# Patient Record
Sex: Male | Born: 1955 | Race: Black or African American | Hispanic: No | Marital: Single | Smoking: Current every day smoker
Health system: Southern US, Community
[De-identification: ages and names within clinical notes are randomized; demographics above are authoritative.]

## PROBLEM LIST (undated history)

## (undated) DIAGNOSIS — C801 Malignant (primary) neoplasm, unspecified: Secondary | ICD-10-CM

---

## 2000-04-29 ENCOUNTER — Emergency Department (HOSPITAL_COMMUNITY): Admission: EM | Admit: 2000-04-29 | Discharge: 2000-04-29 | Payer: Self-pay | Admitting: Emergency Medicine

## 2001-10-17 ENCOUNTER — Emergency Department (HOSPITAL_COMMUNITY): Admission: EM | Admit: 2001-10-17 | Discharge: 2001-10-18 | Payer: Self-pay | Admitting: *Deleted

## 2002-07-14 ENCOUNTER — Encounter: Payer: Self-pay | Admitting: Emergency Medicine

## 2002-07-14 ENCOUNTER — Emergency Department (HOSPITAL_COMMUNITY): Admission: EM | Admit: 2002-07-14 | Discharge: 2002-07-14 | Payer: Self-pay | Admitting: Emergency Medicine

## 2003-04-07 ENCOUNTER — Encounter: Payer: Self-pay | Admitting: Emergency Medicine

## 2003-04-07 ENCOUNTER — Emergency Department (HOSPITAL_COMMUNITY): Admission: EM | Admit: 2003-04-07 | Discharge: 2003-04-07 | Payer: Self-pay | Admitting: Emergency Medicine

## 2004-10-30 ENCOUNTER — Ambulatory Visit (HOSPITAL_COMMUNITY): Admission: RE | Admit: 2004-10-30 | Discharge: 2004-10-30 | Payer: Self-pay | Admitting: Emergency Medicine

## 2004-11-04 ENCOUNTER — Ambulatory Visit (HOSPITAL_COMMUNITY): Admission: RE | Admit: 2004-11-04 | Discharge: 2004-11-04 | Payer: Self-pay | Admitting: Emergency Medicine

## 2004-11-11 ENCOUNTER — Ambulatory Visit (HOSPITAL_COMMUNITY): Admission: RE | Admit: 2004-11-11 | Discharge: 2004-11-11 | Payer: Self-pay | Admitting: Urology

## 2004-11-25 ENCOUNTER — Ambulatory Visit (HOSPITAL_COMMUNITY): Admission: RE | Admit: 2004-11-25 | Discharge: 2004-11-25 | Payer: Self-pay | Admitting: Thoracic Surgery

## 2004-12-24 ENCOUNTER — Encounter: Admission: RE | Admit: 2004-12-24 | Discharge: 2004-12-24 | Payer: Self-pay | Admitting: Thoracic Surgery

## 2005-08-09 ENCOUNTER — Ambulatory Visit: Payer: Self-pay | Admitting: Internal Medicine

## 2005-08-30 ENCOUNTER — Ambulatory Visit: Payer: Self-pay | Admitting: Internal Medicine

## 2005-09-02 ENCOUNTER — Ambulatory Visit (HOSPITAL_COMMUNITY): Admission: RE | Admit: 2005-09-02 | Discharge: 2005-09-02 | Payer: Self-pay | Admitting: Internal Medicine

## 2005-09-06 ENCOUNTER — Ambulatory Visit: Payer: Self-pay | Admitting: Internal Medicine

## 2005-09-13 ENCOUNTER — Ambulatory Visit: Payer: Self-pay | Admitting: Emergency Medicine

## 2005-09-17 ENCOUNTER — Ambulatory Visit: Payer: Self-pay | Admitting: Emergency Medicine

## 2005-09-23 ENCOUNTER — Ambulatory Visit (HOSPITAL_COMMUNITY): Admission: RE | Admit: 2005-09-23 | Discharge: 2005-09-23 | Payer: Self-pay | Admitting: Emergency Medicine

## 2005-11-01 ENCOUNTER — Ambulatory Visit: Payer: Self-pay | Admitting: Emergency Medicine

## 2005-12-02 ENCOUNTER — Ambulatory Visit: Payer: Self-pay | Admitting: *Deleted

## 2005-12-02 ENCOUNTER — Ambulatory Visit: Payer: Self-pay | Admitting: Emergency Medicine

## 2005-12-27 ENCOUNTER — Inpatient Hospital Stay (HOSPITAL_COMMUNITY): Admission: RE | Admit: 2005-12-27 | Discharge: 2006-01-01 | Payer: Self-pay | Admitting: Thoracic Surgery

## 2005-12-27 ENCOUNTER — Ambulatory Visit: Payer: Self-pay | Admitting: Emergency Medicine

## 2006-01-03 ENCOUNTER — Ambulatory Visit (HOSPITAL_COMMUNITY): Admission: RE | Admit: 2006-01-03 | Discharge: 2006-01-03 | Payer: Self-pay | Admitting: Emergency Medicine

## 2006-01-10 ENCOUNTER — Ambulatory Visit: Payer: Self-pay | Admitting: Emergency Medicine

## 2006-01-11 ENCOUNTER — Encounter: Admission: RE | Admit: 2006-01-11 | Discharge: 2006-01-11 | Payer: Self-pay | Admitting: Thoracic Surgery

## 2006-07-05 ENCOUNTER — Ambulatory Visit (HOSPITAL_COMMUNITY): Admission: EM | Admit: 2006-07-05 | Discharge: 2006-07-05 | Payer: Self-pay | Admitting: Emergency Medicine

## 2006-09-19 ENCOUNTER — Emergency Department (HOSPITAL_COMMUNITY): Admission: EM | Admit: 2006-09-19 | Discharge: 2006-09-19 | Payer: Self-pay | Admitting: Emergency Medicine

## 2007-03-28 IMAGING — CT CT ABDOMEN W/ CM
2 of 5 series · 11 of 32 positions shown, 15 images · IV contrast (READICAT & OMNI 300 [ID])
Comparison: CT dated 10/30/04.

CLINICAL DATA: Right cavitary lesion.  Hemoptysis.  Rectal bleeding.  Stent placed six months ago.  
CHEST CT WITH CONTRAST:
TECHNIQUE: Multidetector CT imaging of the chest was performed following the standard protocol during bolus administration of intravenous contrast.
Contrast:  100 cc Omnipaque 300 IV.
TECHNIQUE: Multidetector CT imaging of the abdomen was performed following the standard protocol during bolus administration of intravenous contrast. 
Comparison examination:  11/04/04.
TECHNIQUE: Multidetector CT imaging of the pelvis was performed following the standard protocol during bolus administration of intravenous contrast.

[Series 2: chest abdomen pelvis · axial · 0.70mm/px · z∈[-643,-64]mm · 8 of 153 slices shown]
[im 10/153  soft-tissue]
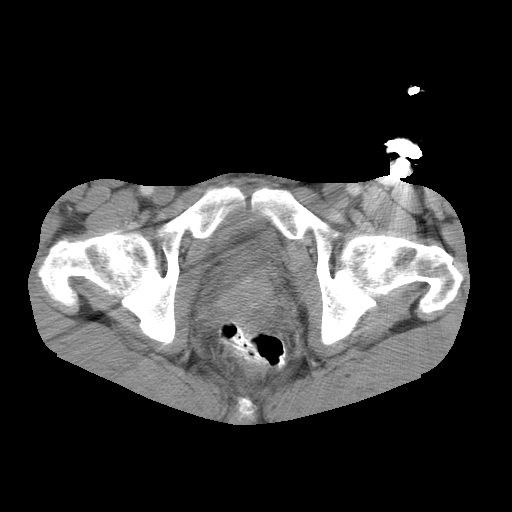
[im 29/153  soft-tissue]
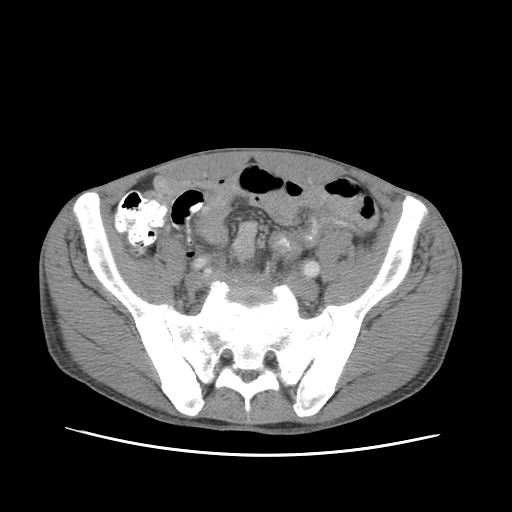
[im 48/153  soft-tissue]
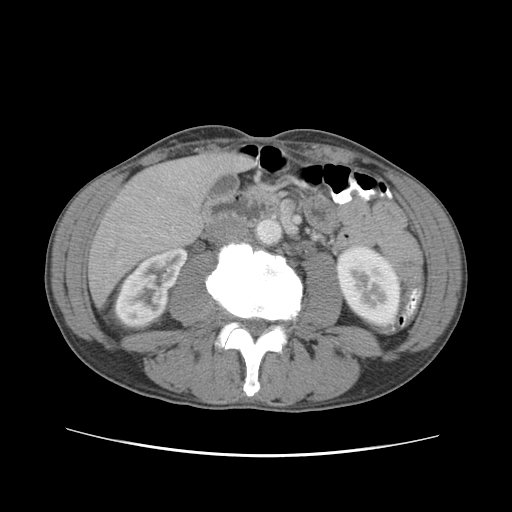
[im 67/153  soft-tissue]
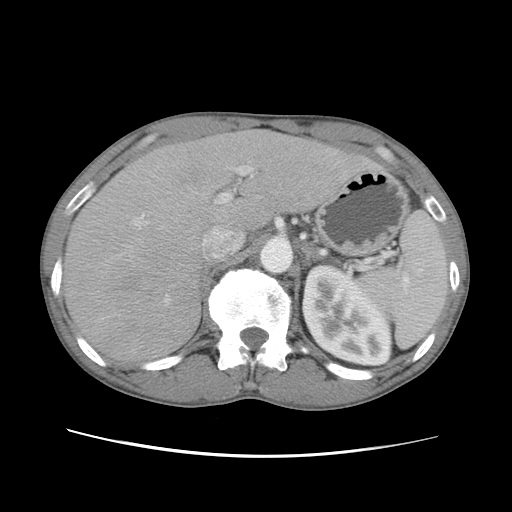
[im 86/153  soft-tissue]
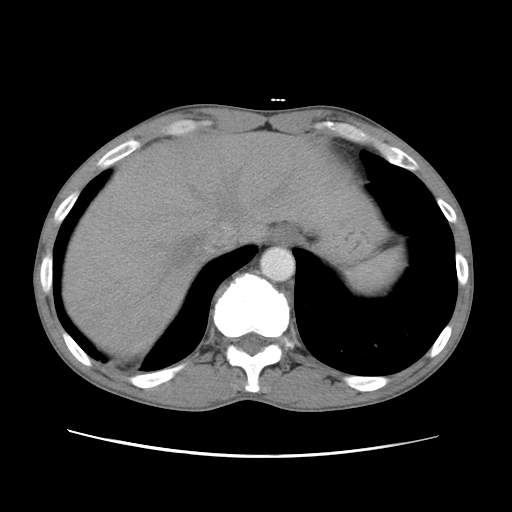
[im 105/153  soft-tissue]
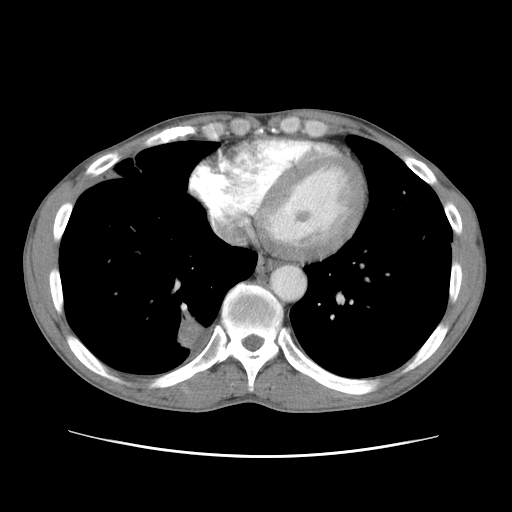
[im 124/153  soft-tissue]
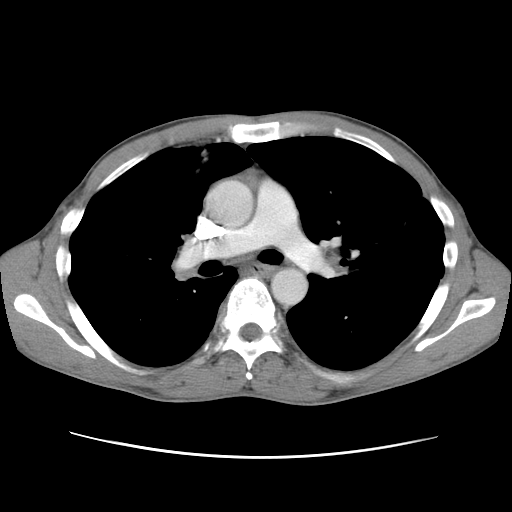
[im 143/153  soft-tissue]
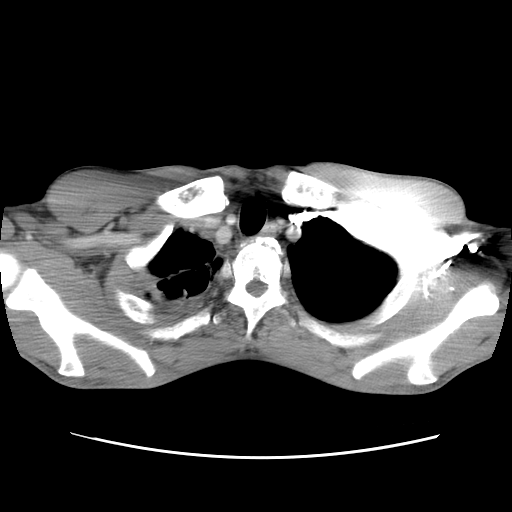

[Series 4: renal delays · axial · 0.70mm/px · z∈[-488,-328]mm · 3 of 33 slices shown, 7 images]
[im 1/33  soft-tissue]
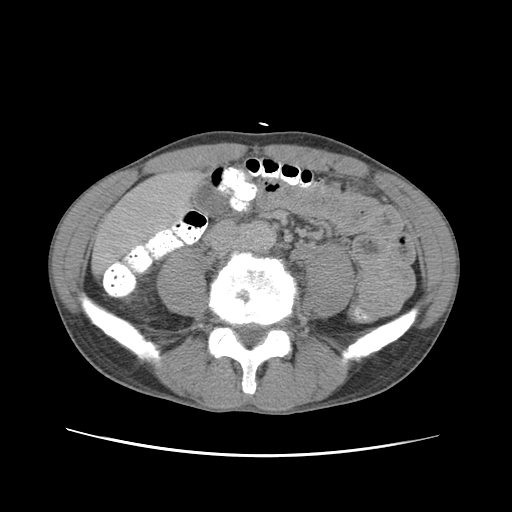
[im 1/33  lung]
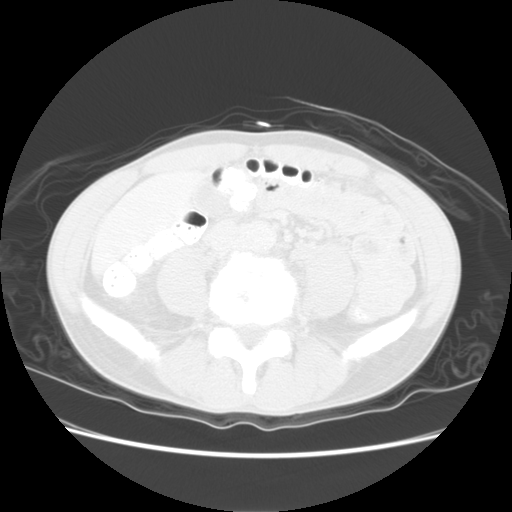
[im 1/33  bone]
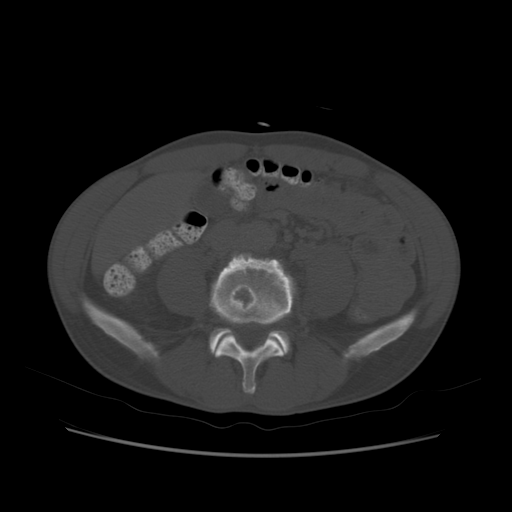
[im 17/33  soft-tissue]
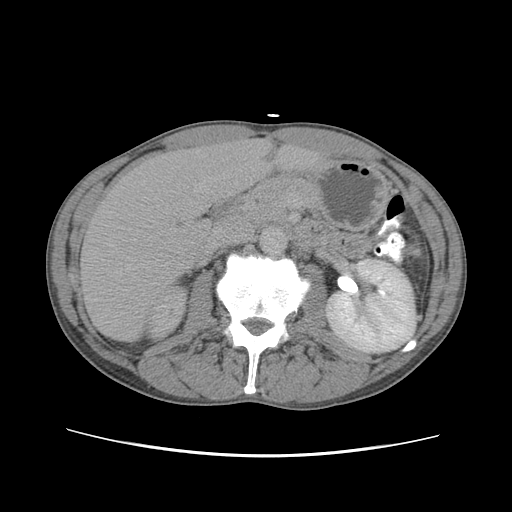
[im 17/33  lung]
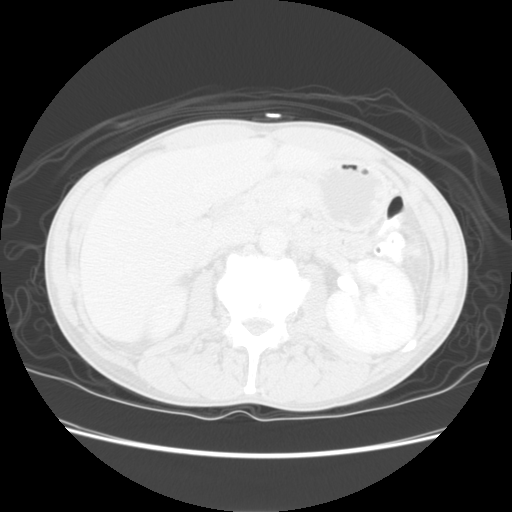
[im 33/33  soft-tissue]
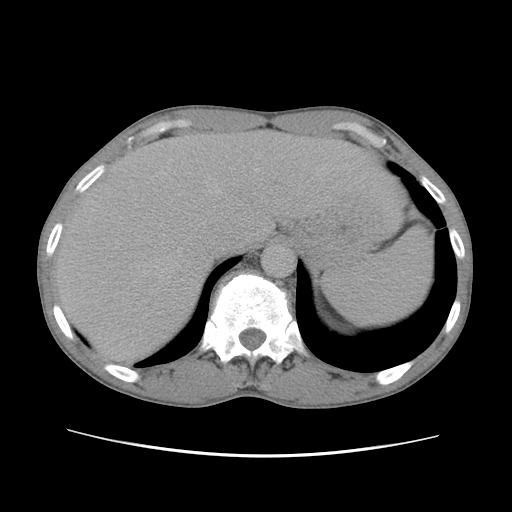
[im 33/33  lung]
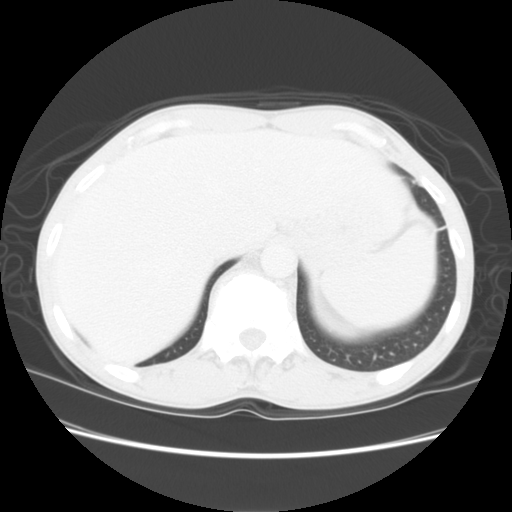

[11 of 32 positions shown; findings below may reference images not displayed]

FINDINGS: Progressive bronchiectasis in the right upper lung zone with prominent cavitary formation with peripheral, irregular thickening along the cavity sites.  The largest cavity spans over 4.8 x 2.3 cm in maximal transverse dimension.  Interval development of a wedge-shaped region of consolidation along the posterior aspect of the right middle lobe extending to the pleura with air bronchograms and mild volume loss.  Interval development of rounded, somewhat wedge-shaped, peripherally located opacity along the posterior aspect of the right lower lobe spanning over a 2 x 2.2 cm in maximal transverse dimension.  Interval development of a wedge-shaped opacity bordering the posterior aspect of the inferior portion of the left major fissure within the left lower lobe measuring 2.2 x 1.9 cm.  Immediately adjacent to this region is a new 0.8 x 1 cm nodule.  Interval development of an oblique, somewhat wedge-shaped opacity along the peripheral aspect of the left upper lobe spanning over 2.6 x 1.6 cm with adjacent pleural thickening.  The mainstem bronchi and adjacent segmental branches appear patent and an obvious pulmonary embolus is not identified.  There is evidence of mild somewhat matted appearing adenopathy in the hilar region and mediastinal region.  All of the above described findings may be related to an infectious process, possibly atypical infection such as tuberculosis however, malignancy is also a consideration.  
The patient has had a stent placed in what appears to be the LAD.  
There is a sclerotic appearance of some of the lower thoracic and lumbar vertebrae.  This may be related to the presence of degenerative changes rather than metastatic disease although the latter would be difficult.  If further delineation were clinically desired, MRI imaging may be considered.
IMPRESSION: 1.  Progressive bilateral pulmonary parenchymal changes as described above.  Question malignant versus infectious with an inflammatory process thought to be a less likely consideration.
2.  Slightly sclerotic appearance of vertebrae at the cervicothoracic junction.  Question degenerative with follow-up as noted above.
ABDOMEN CT WITH CONTRAST:
FINDINGS: Since the prior examination, there has been decompression of the right kidney which appears atrophic and poorly functioning.  On the delayed imaging, there appears to be residual mild fullness of the right collecting system.  This may be related to the prior episodes of obstruction although low-grade, mild, partial obstruction cannot be completely excluded as a cause for this appearance. There is mild infiltration of the fat planes surrounding the right kidney.  The liver, spleen, left kidney, adrenal glands, and pancreas are unremarkable.  
Mild atherosclerotic type changes of the abdominal aorta.  Along the aortic bifurcation, there is a soft tissue process which surrounds the bifurcation extending to the common iliac arteries.  Question retroperitoneal fibrosis versus matted adenopathy with a similar appearance to the prior examination.  Somewhat limited evaluation of bowel secondary to underdistention and poor filling with contrast without gross abnormality noted.  Degenerative changes of the lumbar spine without a bony destructive lesion.
IMPRESSION: 1.  Decompression of right renal collecting system with the right kidney now appearing atrophy with slight prominence of the collecting system with infiltration of the surrounding fat planes.
2.  Mild hazy soft tissue surrounds the aortic bifurcation intending into the common iliac arteries.  Question retroperitoneal fibrosis versus tumor?
PELVIS CT WITH CONTRAST:
FINDINGS: There remains an oval shaped soft tissue process within the right aspect of the pelvis measuring 2.6 x 1.3 cm.  This may have caused the patient?s episode of prior prominent right-sided hydronephrosis.  When this finding is combined with the soft tissue process along the aortic bifurcation, one would question retroperitoneal fibrosis (being asymmetric greater on the right than on the left) however, malignancy such as lymphoma is also of concern.  There are significant bony changes along the pelvis with a sclerotic appearance of the sacrum and adjacent ileum.  This was noted previously.  Question if the patient has had radiation therapy at which point post-radiation therapy type changes with osteonecrosis may need to be considered.  Thickening of the bladder wall may be related to underdistention or post-radiation therapy type changes.
IMPRESSION: 1.  Persistent abnormal appearance of right pelvic sidewall oval-shaped soft tissue mass and abnormal appearance of the sacrum/ilium as described above.
2.  There is a history of rectal bleeding.  A rectal mass or other abnormality cannot be excluded on the present examination secondary to the poor distention of the colon.  One may consider further investigation with colonoscopy.

## 2011-01-09 ENCOUNTER — Encounter: Payer: Self-pay | Admitting: Emergency Medicine

## 2011-01-10 ENCOUNTER — Encounter: Payer: Self-pay | Admitting: Thoracic Surgery

## 2018-03-18 ENCOUNTER — Emergency Department (HOSPITAL_COMMUNITY): Admission: EM | Admit: 2018-03-18 | Discharge: 2018-03-18 | Discharge status: Against Medical Advice | Payer: Self-pay

## 2018-03-18 NOTE — ED Notes (Signed)
Stickers given back to ED staff and patient stated they were leaving

## 2018-05-16 ENCOUNTER — Emergency Department (HOSPITAL_COMMUNITY): Payer: Non-veteran care

## 2018-05-16 ENCOUNTER — Other Ambulatory Visit: Payer: Self-pay

## 2018-05-16 ENCOUNTER — Inpatient Hospital Stay (HOSPITAL_COMMUNITY)
Admission: EM | Admit: 2018-05-16 | Discharge: 2018-05-19 | DRG: 194 | Disposition: A | Discharge status: Home / Self Care | Payer: Non-veteran care | Attending: Internal Medicine | Admitting: Internal Medicine

## 2018-05-16 ENCOUNTER — Encounter (HOSPITAL_COMMUNITY): Payer: Self-pay

## 2018-05-16 DIAGNOSIS — M48061 Spinal stenosis, lumbar region without neurogenic claudication: Secondary | ICD-10-CM | POA: Diagnosis present

## 2018-05-16 DIAGNOSIS — Z6820 Body mass index (BMI) 20.0-20.9, adult: Secondary | ICD-10-CM

## 2018-05-16 DIAGNOSIS — Z930 Tracheostomy status: Secondary | ICD-10-CM

## 2018-05-16 DIAGNOSIS — N189 Chronic kidney disease, unspecified: Secondary | ICD-10-CM | POA: Diagnosis present

## 2018-05-16 DIAGNOSIS — J189 Pneumonia, unspecified organism: Secondary | ICD-10-CM | POA: Diagnosis not present

## 2018-05-16 DIAGNOSIS — E440 Moderate protein-calorie malnutrition: Secondary | ICD-10-CM | POA: Diagnosis not present

## 2018-05-16 DIAGNOSIS — F1010 Alcohol abuse, uncomplicated: Secondary | ICD-10-CM | POA: Diagnosis present

## 2018-05-16 DIAGNOSIS — D638 Anemia in other chronic diseases classified elsewhere: Secondary | ICD-10-CM | POA: Diagnosis present

## 2018-05-16 DIAGNOSIS — M47816 Spondylosis without myelopathy or radiculopathy, lumbar region: Secondary | ICD-10-CM | POA: Diagnosis present

## 2018-05-16 DIAGNOSIS — E871 Hypo-osmolality and hyponatremia: Secondary | ICD-10-CM | POA: Diagnosis present

## 2018-05-16 DIAGNOSIS — B965 Pseudomonas (aeruginosa) (mallei) (pseudomallei) as the cause of diseases classified elsewhere: Secondary | ICD-10-CM | POA: Diagnosis present

## 2018-05-16 DIAGNOSIS — Z931 Gastrostomy status: Secondary | ICD-10-CM

## 2018-05-16 DIAGNOSIS — F17210 Nicotine dependence, cigarettes, uncomplicated: Secondary | ICD-10-CM | POA: Diagnosis present

## 2018-05-16 DIAGNOSIS — J181 Lobar pneumonia, unspecified organism: Secondary | ICD-10-CM | POA: Diagnosis not present

## 2018-05-16 DIAGNOSIS — N179 Acute kidney failure, unspecified: Secondary | ICD-10-CM | POA: Diagnosis not present

## 2018-05-16 DIAGNOSIS — Z8501 Personal history of malignant neoplasm of esophagus: Secondary | ICD-10-CM | POA: Diagnosis not present

## 2018-05-16 DIAGNOSIS — M21371 Foot drop, right foot: Secondary | ICD-10-CM | POA: Diagnosis present

## 2018-05-16 DIAGNOSIS — D649 Anemia, unspecified: Secondary | ICD-10-CM | POA: Diagnosis not present

## 2018-05-16 DIAGNOSIS — J151 Pneumonia due to Pseudomonas: Secondary | ICD-10-CM | POA: Diagnosis not present

## 2018-05-16 HISTORY — DX: Malignant (primary) neoplasm, unspecified: C80.1

## 2018-05-16 LAB — BASIC METABOLIC PANEL
Anion gap: 16 — ABNORMAL HIGH (ref 5–15)
BUN: 46 mg/dL — AB (ref 6–20)
CHLORIDE: 83 mmol/L — AB (ref 101–111)
CO2: 28 mmol/L (ref 22–32)
CREATININE: 1.97 mg/dL — AB (ref 0.61–1.24)
Calcium: 9.4 mg/dL (ref 8.9–10.3)
GFR calc Af Amer: 41 mL/min — ABNORMAL LOW (ref >60)
GFR calc non Af Amer: 35 mL/min — ABNORMAL LOW (ref >60)
Glucose, Bld: 166 mg/dL — ABNORMAL HIGH (ref 65–99)
POTASSIUM: 4.4 mmol/L (ref 3.5–5.1)
Sodium: 127 mmol/L — ABNORMAL LOW (ref 135–145)

## 2018-05-16 LAB — CBC
HCT: 29.6 % — ABNORMAL LOW (ref 39.0–52.0)
HEMOGLOBIN: 10.3 g/dL — AB (ref 13.0–17.0)
MCH: 33.6 pg (ref 26.0–34.0)
MCHC: 34.8 g/dL (ref 30.0–36.0)
MCV: 96.4 fL (ref 78.0–100.0)
Platelets: 407 10*3/uL — ABNORMAL HIGH (ref 150–400)
RBC: 3.07 MIL/uL — AB (ref 4.22–5.81)
RDW: 14.8 % (ref 11.5–15.5)
WBC: 6.9 10*3/uL (ref 4.0–10.5)

## 2018-05-16 LAB — HEPATIC FUNCTION PANEL
ALBUMIN: 3.2 g/dL — AB (ref 3.5–5.0)
ALK PHOS: 60 U/L (ref 38–126)
ALT: 11 U/L — AB (ref 17–63)
AST: 24 U/L (ref 15–41)
BILIRUBIN TOTAL: 0.5 mg/dL (ref 0.3–1.2)
Bilirubin, Direct: 0.2 mg/dL (ref 0.1–0.5)
Indirect Bilirubin: 0.3 mg/dL (ref 0.3–0.9)
TOTAL PROTEIN: 8.6 g/dL — AB (ref 6.5–8.1)

## 2018-05-16 LAB — SODIUM, URINE, RANDOM: Sodium, Ur: 16 mmol/L

## 2018-05-16 LAB — OSMOLALITY, URINE: Osmolality, Ur: 257 mOsm/kg — ABNORMAL LOW (ref 300–900)

## 2018-05-16 MED ORDER — SODIUM CHLORIDE 0.9 % IV SOLN
500.0000 mg | Freq: Once | INTRAVENOUS | Status: AC
  Administered 2018-05-16: 500 mg via INTRAVENOUS
  Filled 2018-05-16: qty 500

## 2018-05-16 MED ORDER — SODIUM CHLORIDE 0.9 % IV SOLN
INTRAVENOUS | Status: AC
  Administered 2018-05-17: 01:00:00 via INTRAVENOUS

## 2018-05-16 MED ORDER — FOLIC ACID 1 MG PO TABS
1.0000 mg | ORAL_TABLET | Freq: Every day | ORAL | Status: DC
  Administered 2018-05-17 – 2018-05-19 (×3): 1 mg via ORAL
  Filled 2018-05-16 (×3): qty 1

## 2018-05-16 MED ORDER — ACETAMINOPHEN 325 MG PO TABS: 650.0000 mg | ORAL_TABLET | Freq: Four times a day (QID) | ORAL | Status: DC | PRN

## 2018-05-16 MED ORDER — ONDANSETRON HCL 4 MG/2ML IJ SOLN: 4.0000 mg | Freq: Four times a day (QID) | INTRAMUSCULAR | Status: DC | PRN

## 2018-05-16 MED ORDER — ACETAMINOPHEN 650 MG RE SUPP: 650.0000 mg | Freq: Four times a day (QID) | RECTAL | Status: DC | PRN

## 2018-05-16 MED ORDER — LORAZEPAM 1 MG PO TABS: 1.0000 mg | ORAL_TABLET | Freq: Four times a day (QID) | ORAL | Status: DC | PRN

## 2018-05-16 MED ORDER — VITAMIN B-1 100 MG PO TABS
100.0000 mg | ORAL_TABLET | Freq: Every day | ORAL | Status: DC
  Administered 2018-05-17 – 2018-05-19 (×3): 100 mg via ORAL
  Filled 2018-05-16 (×3): qty 1

## 2018-05-16 MED ORDER — GUAIFENESIN ER 600 MG PO TB12
600.0000 mg | ORAL_TABLET | Freq: Two times a day (BID) | ORAL | Status: DC
  Administered 2018-05-17 – 2018-05-19 (×6): 600 mg via ORAL
  Filled 2018-05-16 (×6): qty 1

## 2018-05-16 MED ORDER — ENOXAPARIN SODIUM 40 MG/0.4ML ~~LOC~~ SOLN
40.0000 mg | Freq: Every day | SUBCUTANEOUS | Status: DC
  Administered 2018-05-17 – 2018-05-18 (×3): 40 mg via SUBCUTANEOUS
  Filled 2018-05-16 (×3): qty 0.4

## 2018-05-16 MED ORDER — ADULT MULTIVITAMIN W/MINERALS CH
1.0000 | ORAL_TABLET | Freq: Every day | ORAL | Status: DC
  Administered 2018-05-17 – 2018-05-19 (×3): 1 via ORAL
  Filled 2018-05-16 (×3): qty 1

## 2018-05-16 MED ORDER — ONDANSETRON HCL 4 MG PO TABS: 4.0000 mg | ORAL_TABLET | Freq: Four times a day (QID) | ORAL | Status: DC | PRN

## 2018-05-16 MED ORDER — THIAMINE HCL 100 MG/ML IJ SOLN
100.0000 mg | Freq: Every day | INTRAMUSCULAR | Status: DC
  Filled 2018-05-16: qty 2

## 2018-05-16 MED ORDER — SODIUM CHLORIDE 0.9 % IV SOLN
1.0000 g | Freq: Once | INTRAVENOUS | Status: AC
  Administered 2018-05-16: 1 g via INTRAVENOUS
  Filled 2018-05-16: qty 10

## 2018-05-16 MED ORDER — LORAZEPAM 2 MG/ML IJ SOLN
1.0000 mg | Freq: Four times a day (QID) | INTRAMUSCULAR | Status: DC | PRN
Start: 2018-05-16 — End: 2018-05-19

## 2018-05-16 NOTE — ED Notes (Signed)
Respiratory notified pt has a trach.

## 2018-05-16 NOTE — ED Triage Notes (Signed)
Patient was sent from [place] with c/o abnormal labs. Pt state he does know which labs that are abnormal.

## 2018-05-16 NOTE — ED Notes (Signed)
Please call with any information from [facility]: Dr. Drue Dun [phone].

## 2018-05-16 NOTE — Progress Notes (Signed)
RT notified of pt. In ED with Trach in place, noted # 6.0 CFS Shiley Trach, all necessary equipment obtained/placed in room with [place] care done, IC changed out, (had disposable in place), trach ties, tolerated well, RT to monitor.

## 2018-05-16 NOTE — Progress Notes (Signed)
Pt. unable to cough up any mucus at this time for sputum sample, previously suctioned for small amount dark tan mucus after arrival to ED, RT unable to obtain any endotracheally at this time, sputum cup left with pt., RT covering unit made aware of pt. arrival/order. Pt. wearing PMV at this time, not requiring supplemental oxygen, placed humidified oxygen set up in room if needed.

## 2018-05-16 NOTE — ED Notes (Signed)
Pt will tx to 1230 RT at bedside to assist with transport, no c/o pain I will continue to monitor, Pt not on o2. at this time

## 2018-05-16 NOTE — ED Provider Notes (Signed)
[facility] DEPT Provider Note   CSN: [id number] Arrival date & time: 05/16/18  1504     History   Chief Complaint No chief complaint on file.   HPI Jack Mcintyre is a 62 y.o. male.  HPI  62 year old male presents after being told by his [place] office that his labs are very abnormal and he did go to the hospital.  However this call went to his daughter and he does not know what was abnormal and they have not been able to get in contact with her since.  They were told that certain levels were off and that he might have a seizure.  He was told that his liver function tests were abnormal.  The patient states he has only one functioning kidney.  He also smokes and still drinks daily.  The patient denies feeling ill but his family states that he had a change in his chronic cough and sputum out of his trach and it is darker.  He has chronic dyspnea.  He is also been having to use more strength to lift up his right leg and this is an on and off issue.  This is been ongoing for about 3 weeks or more.  Past Medical History:  Diagnosis Date  . Cancer ([place])     There are no active problems to display for this patient.         Home Medications    Prior to Admission medications   Medication Sig Start Date End Date Taking? Authorizing Provider  pseudoephedrine-guaifenesin (MUCINEX D) 60-600 MG 12 hr tablet Take 1 tablet by mouth every 12 (twelve) hours as needed for congestion.   Yes [provider]    Family History No family history on file.  Social History Social History   Tobacco Use  . Smoking status: Not on file  Substance Use Topics  . Alcohol use: Not on file  . Drug use: Not on file     Allergies   Patient has no known allergies.   Review of Systems Review of Systems  Respiratory: Positive for cough and shortness of breath.   Cardiovascular: Negative for chest pain.  Gastrointestinal: Negative for abdominal  pain, diarrhea and vomiting.  Neurological: Positive for weakness. Negative for headaches.  All other systems reviewed and are negative.    Physical Exam Updated Vital Signs BP 105/71 (BP Location: Right Arm)   Pulse 81   Temp 98.3 F (36.8 C) (Oral)   Resp 16   Ht 6' (1.829 m)   Wt 69.4 kg (153 lb)   SpO2 99%   BMI 20.75 kg/m   Physical Exam  Constitutional: He is oriented to person, place, and time. He appears well-developed and well-nourished. No distress.  HENT:  Head: Normocephalic and atraumatic.  Right Ear: External ear normal.  Left Ear: External ear normal.  Nose: Nose normal.  Eyes: Right eye exhibits no discharge. Left eye exhibits no discharge.  Neck: Neck supple.  Trach in place Mild brown discharge/sputum  Cardiovascular: Normal rate, regular rhythm and normal heart sounds.  Pulmonary/Chest: Effort normal and breath sounds normal.  Abdominal: Soft. He exhibits no distension. There is no tenderness.  Musculoskeletal: He exhibits no edema.  Neurological: He is alert and oriented to person, place, and time.  CN 3-12 grossly intact. 5/5 strength in all 4 extremities. Grossly normal sensation. Normal finger to nose.   Skin: Skin is warm and dry. He is not diaphoretic.  Nursing note and  vitals reviewed.    ED Treatments / Results  Labs (all labs ordered are listed, but only abnormal results are displayed) Labs Reviewed  CBC - Abnormal; Notable for the following components:      Result Value   RBC 3.07 (*)    Hemoglobin 10.3 (*)    HCT 29.6 (*)    Platelets 407 (*)    All other components within normal limits  BASIC METABOLIC PANEL - Abnormal; Notable for the following components:   Sodium 127 (*)    Chloride 83 (*)    Glucose, Bld 166 (*)    BUN 46 (*)    Creatinine, Ser 1.97 (*)    GFR calc non Af Amer 35 (*)    GFR calc Af Amer 41 (*)    Anion gap 16 (*)    All other components within normal limits  HEPATIC FUNCTION PANEL - Abnormal; Notable for  the following components:   Total Protein 8.6 (*)    Albumin 3.2 (*)    ALT 11 (*)    All other components within normal limits  CULTURE, BLOOD (ROUTINE X 2)  CULTURE, BLOOD (ROUTINE X 2)  OSMOLALITY, URINE  OSMOLALITY  SODIUM, URINE, RANDOM    EKG None  Radiology Dg Chest 2 View  Result Date: 05/16/2018 CLINICAL DATA:  Smoker. 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 Patient with no acute complaints. EXAM: CHEST - 2 VIEW COMPARISON:  Chest x-rays since January 11, 2006 FINDINGS: Surgical changes seen in the left upper lobe. Mild left perihilar opacity. The masslike opacity in the right upper lobe on the previous study has resolved. There is scattered patchy opacity in the right upper lobe and right perihilar region. Tracheostomy tube is identified. No pneumothorax. The cardiomediastinal silhouette is unremarkable. IMPRESSION: 1. There is patchy opacity in the right upper lobe and right perihilar region. Given the previous cavitary lesion in the right upper lobe in 2007 which has largely resolve, the opacity in the right lung today could represent either scarring, infiltrate/pneumonia, or a combination of scarring and infiltrate. Recommend clinical correlation. 2. Mild infiltrate in left perihilar region as well. 3. Recommend clinical correlation and follow-up to resolution. Electronically Signed   By: Dorise Bullion III M.D   On: 05/16/2018 19:17   Ct Head Wo Contrast  Result Date: 05/16/2018 CLINICAL DATA:  62 year old male with neurologic deficit (not otherwise specified at the time of this report). EXAM: CT HEAD WITHOUT CONTRAST TECHNIQUE: Contiguous axial images were obtained from the base of the skull through the vertex without intravenous contrast. COMPARISON:  PET-CT 10/30/2004 FINDINGS: Brain: No midline shift, ventriculomegaly, mass effect, evidence of mass lesion, intracranial hemorrhage or evidence of cortically based acute infarction. Gray-white matter  differentiation is within normal limits throughout the brain. Cerebral volume appears at the lower limits of normal to decreased for age in a generalized fashion. No cortical encephalomalacia identified. Vascular: Calcified atherosclerosis at the skull base. No suspicious intracranial vascular hyperdensity. Skull: Negative. Sinuses/Orbits: Visualized paranasal sinuses and mastoids are stable and well pneumatized. Other: Visualized orbits and scalp soft tissues are within normal limits. IMPRESSION: Negative for age noncontrast Head CT. Electronically Signed   By: Genevie Ann M.D.   On: 05/16/2018 18:59    Procedures Procedures (including critical care time)  Medications Ordered in ED Medications  cefTRIAXone (ROCEPHIN) 1 g in sodium chloride 0.9 % 100 mL IVPB (has no administration in time range)  azithromycin (ZITHROMAX) 500 mg in sodium chloride 0.9 %  250 mL IVPB (has no administration in time range)     Initial Impression / Assessment and Plan / ED Course  I have reviewed the triage vital signs and the nursing notes.  Pertinent labs & imaging results that were available during my care of the patient were reviewed by me and considered in my medical decision making (see chart for details).     The patient's lab work from the [place] arrived via fax and showed that the sodium today was 120.  However on our labs it is 127.  Otherwise he has renal failure which is of unclear etiology and time course.  No other labs from the [place] available and they have not seen him since before 2017 with lab work.  No lab work in our system.  Given the elevated BUN greater than 2-1 ratio I am concerned for some acute dehydration, especially with his alcohol abuse and poor p.o. intake.  He has had a change in cough and with his chest x-ray findings, this could be scarring but will cover for pneumonia.  I discussed with hospitalist, who asked to hold off on fluids and she will evaluate for admission and treat.  Final Clinical  Impressions(s) / ED Diagnoses   Final diagnoses:  Community acquired pneumonia, unspecified laterality  Acute kidney injury [facility])    ED Discharge Orders    None       Sherwood Gambler, MD 05/16/18 2026

## 2018-05-16 NOTE — H&P (Addendum)
History and Physical    Jack Mcintyre DGL:[id number] DOB: 18-Apr-1956 DOA: 05/16/2018  PCP: [facility]   Patient coming from: Home   Chief Complaint: Cough, abnormal labs  HPI: Jack Mcintyre is a 62 y.o. male with medical history significant for " throat cancer" 2015, with subsequent trach and PEG tube status also 2015, alcohol abuse.  Who presented to the ED today with reports of abnormal labs.  Patient had blood work drawn for the first time today at the [place] [facility] in [place]. He was called to come to Ed, that blood work was normal and that he might have had a seizure, put he does not know the details.    Patient also has a chronic cough which his mother feels is worse, also with change in sputum color now brownish, but unchanged  Sputum amount.  Chronic unchanged shortness of breath.  Chronic trach but on room air. No fever no chills. Patient also reports right lower extremity foot dragging initially intermittently, just noticed 02/2018, now more persistent.  No weakness of  Other extremities, no abnormal sensation, normal speech or facial asymmetry.  Patient lives with his mother.  Patient moved to [facility] from [place] February this year.  Prior to this he had followed up with the [place] in the [place].  She reports last blood work was less than a year ago and he was told everything was normal. Patient reports he has one kidney but has not been told he has kidney disease.  He is not sure about why he has 1 functioning kidney.  Patient's mother reports patient does not drink enough fluids, and does not believe his caloric intake via PEG tube is enough.  Unchanged and stable loose stools-as his diet is liquid.  No vomiting, no abdominal pain, no black stools.  He is currently not on any home medications. Patient reports drinking vodka- I pint, every 2- 3 days.  Denies daily intake of alcohol.  Reports he could go if 1 to 2 days without any alcohol intake.  Patient  smokes.  Patient's mother reports a history of [place] lung infection, in the past.   ED Course: LAb work arrived from [place] via fax- Na- 120. They have no prior records. Blood pressure systolic 51-884, heart rate initially 109 but mostly 80s, O2 sats greater than 97% on room air, respiratory rate 16- 23.  Blood work here - WBC 6.9, hemoglobin low 10.3, sodium low 127, creatinine elevated 1.97, with elevated BUN 46, anion gap 16.  Two-view chest x-ray-patchy opacity right upper lobe and right perihilar region ( see detailed report).  Head CT negative for acute abnormality.  Patient was started on IV ceftriaxone and azithromycin for community-acquired pneumonia.  Review of Systems: As per HPI otherwise 10 point review of systems negative.    Past Medical History:  Diagnosis Date  . Cancer (Regino Ramirez)    No Known Allergies  Family history not contributory  Prior to Admission medications   Medication Sig Start Date End Date Taking? Authorizing Provider  pseudoephedrine-guaifenesin (MUCINEX D) 60-600 MG 12 hr tablet Take 1 tablet by mouth every 12 (twelve) hours as needed for congestion.   Yes [provider]    Physical Exam: Vitals:   05/16/18 1519 05/16/18 1521 05/16/18 1815 05/16/18 1900  BP: (!) 95/59  105/71   Pulse: (!) 109  81   Resp: 16  16 18   Temp: 98.3 F (36.8 C)     TempSrc: Oral  SpO2: 97%  99%   Weight:  69.4 kg (153 lb)    Height:  6' (1.829 m)      Constitutional: NAD, calm, comfortable Vitals:   05/16/18 1519 05/16/18 1521 05/16/18 1815 05/16/18 1900  BP: (!) 95/59  105/71   Pulse: (!) 109  81   Resp: 16  16 18   Temp: 98.3 F (36.8 C)     TempSrc: Oral     SpO2: 97%  99%   Weight:  69.4 kg (153 lb)    Height:  6' (1.829 m)     Eyes: PERRL, lids and conjunctivae normal ENMT: Mucous membranes are moist. Posterior pharynx clear of any exudate or lesions.Normal dentition.  Neck: normal, supple, no masses, no thyromegaly Respiratory: Transmitted upper  airway sounds from chronic trach, no wheezing, no crackles. Normal respiratory effort. No accessory muscle use.  On room air. Cardiovascular: Regular rate and rhythm, no murmurs / rubs / gallops. No extremity edema. 2+ pedal pulses. No carotid bruits.  Abdomen: PEG tube, no tenderness, no masses palpated. No hepatosplenomegaly. Bowel sounds positive.  Musculoskeletal: no clubbing / cyanosis. No joint deformity upper and lower extremities. Good ROM, no contractures. Normal muscle tone.  Skin: no rashes, lesions, ulcers. No induration Neurologic: CN 2-12 grossly intact.  Bilateral upper extremities strength 5/5.  Hip flexors bilateral lower extremities 5/5.  Right lower extremity dorsiflexion- 3/5, compared to left. Psychiatric: Normal judgment and insight. Alert and oriented x 3. Normal mood.   Labs on Admission: I have personally reviewed following labs and imaging studies  CBC: Recent Labs  Lab 05/16/18 1555  WBC 6.9  HGB 10.3*  HCT 29.6*  MCV 96.4  PLT 245*   Basic Metabolic Panel: Recent Labs  Lab 05/16/18 1555  NA 127*  K 4.4  CL 83*  CO2 28  GLUCOSE 166*  BUN 46*  CREATININE 1.97*  CALCIUM 9.4   Liver Function Tests: Recent Labs  Lab 05/16/18 1554  AST 24  ALT 11*  ALKPHOS 60  BILITOT 0.5  PROT 8.6*  ALBUMIN 3.2*    Radiological Exams on Admission: Dg Chest 2 View  Result Date: 05/16/2018 CLINICAL DATA:  Smoker. 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 Patient with no acute complaints. EXAM: CHEST - 2 VIEW COMPARISON:  Chest x-rays since January 11, 2006 FINDINGS: Surgical changes seen in the left upper lobe. Mild left perihilar opacity. The masslike opacity in the right upper lobe on the previous study has resolved. There is scattered patchy opacity in the right upper lobe and right perihilar region. Tracheostomy tube is identified. No pneumothorax. The cardiomediastinal silhouette is unremarkable. IMPRESSION: 1. There is patchy opacity in  the right upper lobe and right perihilar region. Given the previous cavitary lesion in the right upper lobe in 2007 which has largely resolve, the opacity in the right lung today could represent either scarring, infiltrate/pneumonia, or a combination of scarring and infiltrate. Recommend clinical correlation. 2. Mild infiltrate in left perihilar region as well. 3. Recommend clinical correlation and follow-up to resolution. Electronically Signed   By: Dorise Bullion III M.D   On: 05/16/2018 19:17   Ct Head Wo Contrast  Result Date: 05/16/2018 CLINICAL DATA:  62 year old male with neurologic deficit (not otherwise specified at the time of this report). EXAM: CT HEAD WITHOUT CONTRAST TECHNIQUE: Contiguous axial images were obtained from the base of the skull through the vertex without intravenous contrast. COMPARISON:  PET-CT 10/30/2004 FINDINGS: Brain: No midline shift, ventriculomegaly, mass effect,  evidence of mass lesion, intracranial hemorrhage or evidence of cortically based acute infarction. Gray-white matter differentiation is within normal limits throughout the brain. Cerebral volume appears at the lower limits of normal to decreased for age in a generalized fashion. No cortical encephalomalacia identified. Vascular: Calcified atherosclerosis at the skull base. No suspicious intracranial vascular hyperdensity. Skull: Negative. Sinuses/Orbits: Visualized paranasal sinuses and mastoids are stable and well pneumatized. Other: Visualized orbits and scalp soft tissues are within normal limits. IMPRESSION: Negative for age noncontrast Head CT. Electronically Signed   By: Genevie Ann M.D.   On: 05/16/2018 18:59    EKG: None.   Assessment/Plan Principal Problem:   Pneumonia Active Problems:   AKI (acute kidney injury) ([place])   Hyponatremia  Pneumonia-trach & PEG status.  Change in sputum color, and cough, able unchanged SOB. WBC-6.9.  Afebrile. On room air. IV ceftriaxone and azithromycin given in ED.  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  -Continue IV ceftriaxone and azithromycin started in ED. Ceftriaxone would cover possible nocardia infection.  Patient is not on a ventilator, and so holding off on broader-spectrum antibiotics. -Sputum cultures -Mucolytics  AKI- Cr- 1.97, denies hx of kidney disease, except that  He has 1 functioning kidney.  With elevated BUN 46.  Likely prerenal with hyponatremia. -Hydrate normal saline 100cc/hr X 12 hrs - BMP a.m  Hyponatremia- Na- 127. Differentials- Pre-renal Vs beer potomania.  Patient appears euvolemic -Urine osmolality- 257, low -Serum osmolality- pending -Urine sodium- 16 -Hydrate for now - BMP a.m  Anemia-hgb 10. 3. Denies dark stools.  Alcohol abuse history. -Anemia panel  Throat cancer with PEG and Trach status- after "throat Cancer" surgery 2015. 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  But reports drinks 1 pint every 2 to 3 days. Last alcohol intake, beer-2 bottles- 02/15/18 - CIWA  -Start vitamins  Abnormal gait- reports dragging right foot. Exam suggest foot drop- right foot, with reduced dorsiflexion Right lower extremity, ? peroneal nerve imvolvement.  X 2 months. Head Ct negative. Alcohol abuse hx. No back pain. - PT eval.  - MRI lumber spine W contrast to include sacral spine imaging.    DVT prophylaxis: Lovenox Code Status: Full Family Communication: Mother at bedside Disposition Plan: Per rounding team Consults called: None  Admission status: Inpt, Step down- tach status   Bethena Roys MD [facility] Pager [phone] From 6PM-2AM.  Otherwise please contact night-coverage [email or web address] Password [facility]  05/16/2018, 9:38 PM

## 2018-05-17 ENCOUNTER — Inpatient Hospital Stay (HOSPITAL_COMMUNITY): Payer: Non-veteran care

## 2018-05-17 DIAGNOSIS — D649 Anemia, unspecified: Secondary | ICD-10-CM

## 2018-05-17 DIAGNOSIS — F1010 Alcohol abuse, uncomplicated: Secondary | ICD-10-CM

## 2018-05-17 DIAGNOSIS — E440 Moderate protein-calorie malnutrition: Secondary | ICD-10-CM

## 2018-05-17 LAB — BASIC METABOLIC PANEL
ANION GAP: 14 (ref 5–15)
BUN: 44 mg/dL — ABNORMAL HIGH (ref 6–20)
CALCIUM: 9.1 mg/dL (ref 8.9–10.3)
CO2: 27 mmol/L (ref 22–32)
Chloride: 86 mmol/L — ABNORMAL LOW (ref 101–111)
Creatinine, Ser: 1.74 mg/dL — ABNORMAL HIGH (ref 0.61–1.24)
GFR calc Af Amer: 47 mL/min — ABNORMAL LOW (ref >60)
GFR calc non Af Amer: 41 mL/min — ABNORMAL LOW (ref >60)
Glucose, Bld: 98 mg/dL (ref 65–99)
POTASSIUM: 4 mmol/L (ref 3.5–5.1)
SODIUM: 127 mmol/L — AB (ref 135–145)

## 2018-05-17 LAB — GLUCOSE, CAPILLARY
GLUCOSE-CAPILLARY: 112 mg/dL — AB (ref 65–99)
GLUCOSE-CAPILLARY: 108 mg/dL — AB (ref 65–99)
Glucose-Capillary: 132 mg/dL — ABNORMAL HIGH (ref 65–99)

## 2018-05-17 LAB — STREP PNEUMONIAE URINARY ANTIGEN: Strep Pneumo Urinary Antigen: NEGATIVE

## 2018-05-17 LAB — IRON AND TIBC
IRON: 21 ug/dL — AB (ref 45–182)
Saturation Ratios: 6 % — ABNORMAL LOW (ref 17.9–39.5)
TIBC: 339 ug/dL (ref 250–450)
UIBC: 318 ug/dL

## 2018-05-17 LAB — MAGNESIUM
MAGNESIUM: 2.3 mg/dL (ref 1.7–2.4)
Magnesium: 2.3 mg/dL (ref 1.7–2.4)

## 2018-05-17 LAB — EXPECTORATED SPUTUM ASSESSMENT W GRAM STAIN, RFLX TO RESP C

## 2018-05-17 LAB — MRSA PCR SCREENING: MRSA BY PCR: NEGATIVE

## 2018-05-17 LAB — VITAMIN B12: VITAMIN B 12: 819 pg/mL (ref 180–914)

## 2018-05-17 LAB — PHOSPHORUS
Phosphorus: 4 mg/dL (ref 2.5–4.6)
Phosphorus: 3.6 mg/dL (ref 2.5–4.6)

## 2018-05-17 LAB — HIV ANTIBODY (ROUTINE TESTING W REFLEX): HIV Screen 4th Generation wRfx: NONREACTIVE

## 2018-05-17 LAB — EXPECTORATED SPUTUM ASSESSMENT W REFEX TO RESP CULTURE

## 2018-05-17 LAB — FERRITIN: Ferritin: 165 ng/mL (ref 24–336)

## 2018-05-17 MED ORDER — FREE WATER
200.0000 mL | Freq: Four times a day (QID) | Status: DC
  Administered 2018-05-17 – 2018-05-18 (×7): 200 mL

## 2018-05-17 MED ORDER — GADOBENATE DIMEGLUMINE 529 MG/ML IV SOLN
15.0000 mL | Freq: Once | INTRAVENOUS | Status: AC | PRN
  Administered 2018-05-17: 14 mL via INTRAVENOUS

## 2018-05-17 MED ORDER — SODIUM CHLORIDE 0.9 % IV SOLN
500.0000 mg | INTRAVENOUS | Status: DC
  Administered 2018-05-17 – 2018-05-18 (×2): 500 mg via INTRAVENOUS
  Filled 2018-05-17 (×3): qty 500

## 2018-05-17 MED ORDER — ENSURE ENLIVE PO LIQD
237.0000 mL | Freq: Three times a day (TID) | ORAL | Status: DC
  Administered 2018-05-17: 237 mL via ORAL

## 2018-05-17 MED ORDER — NON FORMULARY: 237.0000 mL | Freq: Four times a day (QID) | Status: DC

## 2018-05-17 MED ORDER — TWOCAL HN PO LIQD
237.0000 mL | Freq: Four times a day (QID) | ORAL | Status: DC
  Administered 2018-05-17 – 2018-05-18 (×7): 237 mL
  Filled 2018-05-17 (×12): qty 237

## 2018-05-17 MED ORDER — PRO-STAT SUGAR FREE PO LIQD
30.0000 mL | Freq: Every day | ORAL | Status: DC
  Administered 2018-05-17 – 2018-05-19 (×3): 30 mL
  Filled 2018-05-17 (×3): qty 30

## 2018-05-17 MED ORDER — CEFTRIAXONE SODIUM 1 G IJ SOLR
1.0000 g | INTRAMUSCULAR | Status: DC
  Administered 2018-05-17 – 2018-05-18 (×2): 1 g via INTRAVENOUS
  Filled 2018-05-17: qty 10
  Filled 2018-05-17 (×2): qty 1

## 2018-05-17 MED ORDER — VITAL HIGH PROTEIN PO LIQD: 1000.0000 mL | ORAL | Status: DC

## 2018-05-17 NOTE — Progress Notes (Addendum)
Pt seen and has #6 cuffless shiley trach in place with pmv.  Pt appears comfortable, watching tv, no distress noted or voiced by pt at this time. HR85, rr17, spo2 94% on room air.  Pt states he does not wear humidified oxygen/atc at home and does not want to be placed on atc at this time.  RT will monitor and assess pt as needed.

## 2018-05-17 NOTE — Progress Notes (Signed)
Initial Nutrition Assessment  DOCUMENTATION CODES:   Non-severe (moderate) malnutrition in context of chronic illness  INTERVENTION:  Will order 1 carton TwoCal HN QID (0800, 1200, 1600, 2000) with 30 mL Prostat (or equivalent) once/day 200 mL free water with each bolus. This regimen will provide 2000 kcal, 95 grams of protein, and 1464 mL free water.    NUTRITION DIAGNOSIS:   Moderate Malnutrition related to chronic illness, catabolic illness, cancer and cancer related treatments as evidenced by mild fat depletion, mild muscle depletion, moderate fat depletion, moderate muscle depletion.  GOAL:   Patient will meet greater than or equal to 90% of their needs  MONITOR:   TF tolerance, Weight trends, Labs  REASON FOR ASSESSMENT:   Consult Enteral/tube feeding initiation and management  ASSESSMENT:   62 y.o. male with medical history significant for " throat cancer" 2015, with subsequent trach and PEG tube status also 2015, alcohol abuse. He presented to the ED with reports of abnormal labs. Patient had blood work drawn at the [facility] clinic in [place] and encouraged to come to the ED because of abnormal labs and it was thought that he might have had a seizure.  BMI indicates normal weight. Pt with trach with PMV in place and able to verbally communicate without issue. He confirms PEG placement 3-4 years ago and that initially he only used it once in a while but over time he has depended on it more heavily, partly out of convenience. He states that the last time he ate solid food was 4-5 months ago and that he was eating items such as bbq chicken and eggs. He does take sips of liquids but nothing substantial for nutrition; saw in the notes that pt drinks 1 pint of vodka every 2-3 days.   At home, he gives himself boluses of TF: 3-4 cartons of TwoCal HN per day with 16 ounces of water with each bolus. Sometimes he does extra water throughout the day, but nothing consistent. This regimen  provides 1425-1900 kcal, 60-80 grams of protein, and 1917-2556 mL water per day.   Pt is currently ordered Ensure Enlive TID via PEG which provides 1050 kcal and 60 grams of protein per day.  Will order TF as outlined above.   NFPE outlined below. Pt reports UBW of 150-155 lbs and that he has weighed this since the time of trach and PEG placement. He reports that prior to that he had lost weight and has had a difficult time regaining it.    Medications reviewed; 1 mg oral folic acid/day, daily multivitamin with minerals, 100 mg thiamine/day.  Labs reviewed; Na: 127 mmol/L, Cl: 86 mmol/L, BUN: 44 mg/dL, creatinine: 1.74 mg/dL, GFR: 47 mL/min.      NUTRITION - FOCUSED PHYSICAL EXAM:    Most Recent Value  Orbital Region  No depletion  Upper Arm Region  Moderate depletion  Thoracic and Lumbar Region  Unable to assess  Buccal Region  Mild depletion  Temple Region  Mild depletion  Clavicle Bone Region  Mild depletion  Clavicle and Acromion Bone Region  Moderate depletion  Scapular Bone Region  Unable to assess  Dorsal Hand  No depletion  Patellar Region  Mild depletion  Anterior Thigh Region  Mild depletion  Posterior Calf Region  Mild depletion  Edema (RD Assessment)  None  Hair  Reviewed  Eyes  Reviewed  Mouth  Reviewed  Skin  Reviewed  Nails  Reviewed       Diet Order:   Diet Order  Diet NPO time specified Except for: Sips with Meds  Diet effective now          EDUCATION NEEDS:   No education needs have been identified at this time  Skin:  Skin Assessment: Reviewed RN Assessment  Last BM:  5/28  Height:   Ht Readings from Last 1 Encounters:  05/16/18 6' (1.829 m)    Weight:   Wt Readings from Last 1 Encounters:  05/16/18 151 lb 0.2 oz (68.5 kg)    Ideal Body Weight:  80.91 kg  BMI:  Body mass index is 20.48 kg/m.  Estimated Nutritional Needs:   Kcal:  1920-2190 (28-32 kcal/kg)  Protein:  85-96 grams (1.2-1.4 grams/kg)  Fluid:  >/= 2  L/day      Jarome Matin, MS, RD, LDN, [facility] Inpatient Clinical Dietitian Pager # [phone] After hours/weekend pager # [phone]

## 2018-05-17 NOTE — Progress Notes (Addendum)
PROGRESS NOTE    Jack Mcintyre  TDD:[id number] DOB: Feb 08, 1956 DOA: 05/16/2018 PCP: [facility]   Brief Narrative:  HPI on 05/16/2018 by Dr. Jenetta Downer Jack Mcintyre is a 62 y.o. male with medical history significant for " throat cancer" 2015, with subsequent trach and PEG tube status also 2015, alcohol abuse.  Who presented to the ED today with reports of abnormal labs.  Patient had blood work drawn for the first time today at the [facility] in [place]. He was called to come to [place], that blood work was normal and that he might have had a seizure, put he does not know the details.    Patient also has a chronic cough which his mother feels is worse, also with change in sputum color now brownish, but unchanged  Sputum amount.  Chronic unchanged shortness of breath.  Chronic trach but on room air. No fever no chills. Patient also reports right lower extremity foot dragging initially intermittently, just noticed 02/2018, now more persistent.  No weakness of  Other extremities, no abnormal sensation, normal speech or facial asymmetry.  Patient lives with his mother.  Patient moved to [facility] from [place] February this year.  Prior to this he had followed up with the [facility] in the [place].  She reports last blood work was less than a year ago and he was told everything was normal. Patient reports he has one kidney but has not been told he has kidney disease.  He is not sure about why he has 1 functioning kidney.  Patient's mother reports patient does not drink enough fluids, and does not believe his caloric intake via PEG tube is enough.  Unchanged and stable loose stools-as his diet is liquid.  No vomiting, no abdominal pain, no black stools.  He is currently not on any home medications. Patient reports drinking vodka- I pint, every 2- 3 days.  Denies daily intake of alcohol.  Reports he could go if 1 to 2 days without any alcohol intake.  Patient smokes.  Patient's mother  reports a history of [place] lung infection, in the past.   Interim history  Admitted for pneumonia.  Also found to have hyponatremia with acute kidney injury. Assessment & Plan   Pneumonia -Status post trach and PEG with a history of throat cancer -Patient presented with cough, change in sputum color and shortness of breath -Chest x-ray showed right upper lobe opacity -Placed on azithromycin and ceftriaxone -?given alcohol use and recent inability to suction, ?aspiration  -Continue mucolytics and suctioning -Blood and Respiratory cultures pending -Patient currently afebrile with no leukocytosis and on room air  Acute kidney injury on chronic kidney disease, unknown stage -States he has 1 functioning kidney -Upon review of patient's chart, no prior laboratory results are available -Currently creatinine is 1.74 (improved from admission 1.94) -Continue IV fluids and monitor BMP  Hyponatremia -Patient currently with a sodium of 127.  As above no prior laboratory results available for review. -Possibly secondary to prerenal versus be reported mania as patient does consume alcohol -Continue IV fluid and monitor BMP -Currently, no neurological deficits  Normocytic anemia -Suspect secondary to chronic kidney disease -Hemoglobin currently 10.3 -Denies any recent history of dark stools, hematemesis, melena or hematochezia -Anemia panel: Iron 21, ferritin 165, saturation ratio 6.  Vitamin B12 819 -Continue to monitor CBC  History of throat cancer -Surgery in 2015 -Per mother, vocal cords were involved -Patient has PEG and trach -Nutrition consulted  Moderate malnutrition -  Nutrition consulted, continue tube feeds and prostat  Alcohol abuse -Admits to drinking 1 pint every 2 to 3 days.  Last alcohol intake was 2 beers -Per family at bedside, states patient consumes more than this -Continue CIWA protocol, multivitamin, thiamine, folic acid  Abnormal gait -Reports right foot  dragging -Patient with reduced right dorsiflexion-has been ongoing for months -CT head unremarkable -PT evaluated patient, no further therapy needs -MRI lumbar spine pending  DVT Prophylaxis  lovenox  Code Status: Full  Family Communication: Family at bedside  Disposition Plan: Admitted. Pending improvement in sodium. Will try to obtain records.   Consultants None  Procedures  None  Antibiotics   Anti-infectives (From admission, onward)   Start     Dose/Rate Route Frequency Ordered Stop   05/17/18 2000  azithromycin (ZITHROMAX) 500 mg in sodium chloride 0.9 % 250 mL IVPB     500 mg 250 mL/hr over 60 Minutes Intravenous Every 24 hours 05/17/18 0040     05/17/18 1800  cefTRIAXone (ROCEPHIN) 1 g in sodium chloride 0.9 % 100 mL IVPB     1 g 200 mL/hr over 30 Minutes Intravenous Every 24 hours 05/17/18 0040     05/16/18 1945  cefTRIAXone (ROCEPHIN) 1 g in sodium chloride 0.9 % 100 mL IVPB     1 g 200 mL/hr over 30 Minutes Intravenous  Once 05/16/18 1931 05/16/18 2150   05/16/18 1945  azithromycin (ZITHROMAX) 500 mg in sodium chloride 0.9 % 250 mL IVPB     500 mg 250 mL/hr over 60 Minutes Intravenous  Once 05/16/18 1931 05/17/18 0057      Subjective:   Jack Mcintyre seen and examined today.  Feels he is breathing better.  Continues to have cough.  States he is able to suction himself more today.  Patient's machine that allows him to suction at home broke down, however states the [facility] will be replacing this.  Denies current chest pain, abdominal pain, nausea or vomiting, diarrhea or constipation.  Daughter and mother at bedside inquire about patient's alcohol use and wonder if this could lead to his pneumonia.  Objective:   Vitals:   05/17/18 0736 05/17/18 0800 05/17/18 0900 05/17/18 1000  BP:  (!) 90/53 (!) 90/54 (!) 102/59  Pulse:  85 81 77  Resp:  14 15 10   Temp:      TempSrc:      SpO2: 100% 99% 99% 94%  Weight:      Height:        Intake/Output Summary (Last 24  hours) at 05/17/2018 1104 Last data filed at 05/17/2018 0600 Gross per 24 hour  Intake 718.33 ml  Output 150 ml  Net 568.33 ml   Filed Weights   05/16/18 1521 05/16/18 2353  Weight: 69.4 kg (153 lb) 68.5 kg (151 lb 0.2 oz)    Exam  General: Well developed, thin, NAD  HEENT: NCAT, PERRLA, EOMI, Anicteic Sclera, mucous membranes moist.   Neck: Supple, +trach  Cardiovascular: S1 S2 auscultated, no rubs, murmurs or gallops. Regular rate and rhythm.  Respiratory: Diminished but clear, able to suction, productive cough  Abdomen: Soft, nontender, nondistended, + bowel sounds, +peg  Extremities: warm dry without cyanosis clubbing or edema  Neuro: AAOx3, nonfocal  Psych: Normal affect and demeanor with intact judgement and insight   Data Reviewed: I have personally reviewed following labs and imaging studies  CBC: Recent Labs  Lab 05/16/18 1555  WBC 6.9  HGB 10.3*  HCT 29.6*  MCV 96.4  PLT  161*   Basic Metabolic Panel: Recent Labs  Lab 05/16/18 1555 05/17/18 0329  NA 127* 127*  K 4.4 4.0  CL 83* 86*  CO2 28 27  GLUCOSE 166* 98  BUN 46* 44*  CREATININE 1.97* 1.74*  CALCIUM 9.4 9.1   GFR: Estimated Creatinine Clearance: 43.7 mL/min (A) (by C-G formula based on SCr of 1.74 mg/dL (H)). Liver Function Tests: Recent Labs  Lab 05/16/18 1554  AST 24  ALT 11*  ALKPHOS 60  BILITOT 0.5  PROT 8.6*  ALBUMIN 3.2*   No results for input(s): LIPASE, AMYLASE in the last 168 hours. No results for input(s): AMMONIA in the last 168 hours. Coagulation Profile: No results for input(s): INR, PROTIME in the last 168 hours. Cardiac Enzymes: No results for input(s): CKTOTAL, CKMB, CKMBINDEX, TROPONINI in the last 168 hours. BNP (last 3 results) No results for input(s): PROBNP in the last 8760 hours. HbA1C: No results for input(s): HGBA1C in the last 72 hours. CBG: No results for input(s): GLUCAP in the last 168 hours. Lipid Profile: No results for input(s): CHOL, HDL,  LDLCALC, TRIG, CHOLHDL, LDLDIRECT in the last 72 hours. Thyroid Function Tests: No results for input(s): TSH, T4TOTAL, FREET4, T3FREE, THYROIDAB in the last 72 hours. Anemia Panel: Recent Labs    05/17/18 0329  VITAMINB12 819  FERRITIN 165  TIBC 339  IRON 21*   Urine analysis: No results found for: COLORURINE, APPEARANCEUR, LABSPEC, PHURINE, GLUCOSEU, HGBUR, BILIRUBINUR, KETONESUR, PROTEINUR, UROBILINOGEN, NITRITE, LEUKOCYTESUR Sepsis Labs: @LABRCNTIP (procalcitonin:4,lacticidven:4)  ) Recent Results (from the past 240 hour(s))  Culture, expectorated sputum-assessment     Status: None   Collection Time: 05/16/18 10:20 PM  Result Value Ref Range Status   Specimen Description SPU  Final   Special Requests NONE  Final   Sputum evaluation   Final    THIS SPECIMEN IS ACCEPTABLE FOR SPUTUM CULTURE Performed at [facility], [place] [address], [place], [place] [address]    Report Status 05/17/2018 FINAL  Final  Culture, respiratory (NON-Expectorated)     Status: None (Preliminary result)   Collection Time: 05/16/18 10:20 PM  Result Value Ref Range Status   Specimen Description SPU  Final   Special Requests   Final    NONE Reflexed from [id number] Performed at [facility], [place] [address], [place], [place] [address]    Gram Stain   Final    RARE WBC PRESENT, PREDOMINANTLY PMN FEW SQUAMOUS EPITHELIAL CELLS PRESENT MODERATE GRAM POSITIVE COCCI FEW GRAM POSITIVE RODS RARE GRAM NEGATIVE RODS    Culture PENDING  Incomplete   Report Status PENDING  Incomplete  MRSA PCR Screening     Status: None   Collection Time: 05/16/18 11:38 PM  Result Value Ref Range Status   MRSA by PCR NEGATIVE NEGATIVE Final    Comment:        The GeneXpert MRSA Assay (FDA approved for NASAL specimens only), is one component of a comprehensive MRSA colonization surveillance program. It is not intended to diagnose MRSA infection nor to guide or monitor treatment  for MRSA infections. Performed at [facility], [place] [address] [place], [place] [address]       Radiology Studies: Dg Chest 2 View  Result Date: 05/16/2018 CLINICAL DATA:  Smoker. 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 Patient with no acute complaints. EXAM: CHEST - 2 VIEW COMPARISON:  Chest x-rays since January 11, 2006 FINDINGS: Surgical changes seen in the left upper lobe. Mild left  perihilar opacity. The masslike opacity in the right upper lobe on the previous study has resolved. There is scattered patchy opacity in the right upper lobe and right perihilar region. Tracheostomy tube is identified. No pneumothorax. The cardiomediastinal silhouette is unremarkable. IMPRESSION: 1. There is patchy opacity in the right upper lobe and right perihilar region. Given the previous cavitary lesion in the right upper lobe in 2007 which has largely resolve, the opacity in the right lung today could represent either scarring, infiltrate/pneumonia, or a combination of scarring and infiltrate. Recommend clinical correlation. 2. Mild infiltrate in left perihilar region as well. 3. Recommend clinical correlation and follow-up to resolution. Electronically Signed   By: Dorise Bullion III M.D   On: 05/16/2018 19:17   Ct Head Wo Contrast  Result Date: 05/16/2018 CLINICAL DATA:  62 year old male with neurologic deficit (not otherwise specified at the time of this report). EXAM: CT HEAD WITHOUT CONTRAST TECHNIQUE: Contiguous axial images were obtained from the base of the skull through the vertex without intravenous contrast. COMPARISON:  PET-CT 10/30/2004 FINDINGS: Brain: No midline shift, ventriculomegaly, mass effect, evidence of mass lesion, intracranial hemorrhage or evidence of cortically based acute infarction. Gray-white matter differentiation is within normal limits throughout the brain. Cerebral volume appears at the lower limits of normal to decreased for age in a  generalized fashion. No cortical encephalomalacia identified. Vascular: Calcified atherosclerosis at the skull base. No suspicious intracranial vascular hyperdensity. Skull: Negative. Sinuses/Orbits: Visualized paranasal sinuses and mastoids are stable and well pneumatized. Other: Visualized orbits and scalp soft tissues are within normal limits. IMPRESSION: Negative for age noncontrast Head CT. Electronically Signed   By: Genevie Ann M.D.   On: 05/16/2018 18:59     Scheduled Meds: . enoxaparin (LOVENOX) injection  40 mg Subcutaneous QHS  . feeding supplement (ENSURE ENLIVE)  237 mL Oral M0L  . folic acid  1 mg Oral Daily  . guaiFENesin  600 mg Oral BID  . multivitamin with minerals  1 tablet Oral Daily  . thiamine  100 mg Oral Daily   Continuous Infusions: . azithromycin    . cefTRIAXone (ROCEPHIN)  IV       LOS: 1 day   Time Spent in minutes   45 minutes  Chaim Gatley D.O. on 05/17/2018 at 11:04 AM  Between 7am to 7pm - Pager - [phone]  After 7pm go to [email or web address] - password [email or web address]  And look for the night coverage person covering for me after hours  [facility] Office  [phone]

## 2018-05-17 NOTE — Evaluation (Signed)
Physical Therapy Evaluation Patient Details Name: Jack Mcintyre MRN: [id number] DOB: [birth year] Today's Date: 05/17/2018   History of Present Illness  Pt admitted with abnormal labs and with hx of throat CA with trach in place  Clinical Impression  Pt admitted as above and presenting with functional mobility limitations 2* ltd endurance, generalized weakness and ambulatory balance deficits with noted R foot drop.  Pt should progress to dc home with family assist.    Follow Up Recommendations No PT follow up    Equipment Recommendations  None recommended by PT    Recommendations for Other Services       Precautions / Restrictions Precautions Precautions: Fall Precaution Comments: has peg tube in place Restrictions Weight Bearing Restrictions: No      Mobility  Bed Mobility Overal bed mobility: Modified Independent             General bed mobility comments: Pt unassisted to/from bed  Transfers Overall transfer level: Modified independent   Transfers: Sit to/from Stand Sit to Stand: Modified independent (Device/Increase time)            Ambulation/Gait Ambulation/Gait assistance: Min assist;Min guard Ambulation Distance (Feet): 150 Feet Assistive device: 1 person hand held assist Gait Pattern/deviations: Step-through pattern;Shuffle;Wide base of support;Trunk flexed Gait velocity: decr   General Gait Details: Steady shuffling gait with pt insistent on wearing flipflops to ambulate.  Pt tripped over R toes x 2 with LOB but no fall   Stairs            Wheelchair Mobility    Modified Rankin (Stroke Patients Only)       Balance Overall balance assessment: Needs assistance Sitting-balance support: No upper extremity supported;Feet supported Sitting balance-Leahy Scale: Good       Standing balance-Leahy Scale: Good                               Pertinent Vitals/Pain Pain Assessment: No/denies pain    Home Living Family/patient  expects to be discharged to:: Private residence Living Arrangements: Children Available Help at Discharge: Family Type of Home: House Home Access: Stairs to enter   Technical brewer of Steps: 1 Home Layout: One level Home Equipment: None      Prior Function Level of Independence: Independent               Hand Dominance        Extremity/Trunk Assessment   Upper Extremity Assessment Upper Extremity Assessment: Overall WFL for tasks assessed    Lower Extremity Assessment Lower Extremity Assessment: Generalized weakness       Communication   Communication: Tracheostomy;Passy-Muir valve  Cognition Arousal/Alertness: Awake/alert Behavior During Therapy: WFL for tasks assessed/performed Overall Cognitive Status: Within Functional Limits for tasks assessed                                        General Comments      Exercises     Assessment/Plan    PT Assessment Patient needs continued PT services  PT Problem List Decreased strength;Decreased activity tolerance;Decreased balance;Decreased mobility;Decreased knowledge of use of DME       PT Treatment Interventions DME instruction;Gait training;Stair training;Functional mobility training;Therapeutic activities;Therapeutic exercise;Balance training;Patient/family education    PT Goals (Current goals can be found in the Care Plan section)  Acute Rehab PT Goals Patient Stated Goal: Regain  strength and return home PT Goal Formulation: With patient Time For Goal Achievement: 05/31/18 Potential to Achieve Goals: Good    Frequency Min 3X/week   Barriers to discharge        Co-evaluation               AM-PAC PT "6 Clicks" Daily Activity  Outcome Measure Difficulty turning over in bed (including adjusting bedclothes, sheets and blankets)?: None Difficulty moving from lying on back to sitting on the side of the bed? : None Difficulty sitting down on and standing up from a chair  with arms (e.g., wheelchair, bedside commode, etc,.)?: A Little Help needed moving to and from a bed to chair (including a wheelchair)?: A Little Help needed walking in hospital room?: A Little Help needed climbing 3-5 steps with a railing? : A Little 6 Click Score: 20    End of Session   Activity Tolerance: Patient tolerated treatment well;Patient limited by fatigue Patient left: in bed;with call bell/phone within reach;Other (comment)(dietician in room) Nurse Communication: Mobility status PT Visit Diagnosis: Unsteadiness on feet (R26.81)    Time: 1010-1030 PT Time Calculation (min) (ACUTE ONLY): 20 min   Charges:   PT Evaluation $PT Eval Low Complexity: 1 Low     PT G Codes:        Pg [phone]   Diella Gillingham 05/17/2018, 10:55 AM

## 2018-05-18 DIAGNOSIS — J181 Lobar pneumonia, unspecified organism: Principal | ICD-10-CM

## 2018-05-18 DIAGNOSIS — N179 Acute kidney failure, unspecified: Secondary | ICD-10-CM

## 2018-05-18 DIAGNOSIS — E440 Moderate protein-calorie malnutrition: Secondary | ICD-10-CM

## 2018-05-18 DIAGNOSIS — E871 Hypo-osmolality and hyponatremia: Secondary | ICD-10-CM

## 2018-05-18 LAB — GLUCOSE, CAPILLARY
GLUCOSE-CAPILLARY: 96 mg/dL (ref 65–99)
GLUCOSE-CAPILLARY: 109 mg/dL — AB (ref 65–99)
Glucose-Capillary: 101 mg/dL — ABNORMAL HIGH (ref 65–99)
Glucose-Capillary: 109 mg/dL — ABNORMAL HIGH (ref 65–99)
Glucose-Capillary: 110 mg/dL — ABNORMAL HIGH (ref 65–99)
Glucose-Capillary: 111 mg/dL — ABNORMAL HIGH (ref 65–99)
Glucose-Capillary: 171 mg/dL — ABNORMAL HIGH (ref 65–99)

## 2018-05-18 LAB — BASIC METABOLIC PANEL
ANION GAP: 10 (ref 5–15)
BUN: 35 mg/dL — ABNORMAL HIGH (ref 6–20)
CALCIUM: 8.9 mg/dL (ref 8.9–10.3)
CO2: 27 mmol/L (ref 22–32)
Chloride: 91 mmol/L — ABNORMAL LOW (ref 101–111)
Creatinine, Ser: 1.43 mg/dL — ABNORMAL HIGH (ref 0.61–1.24)
GFR, EST NON AFRICAN AMERICAN: 52 mL/min — AB (ref >60)
GLUCOSE: 96 mg/dL (ref 65–99)
Potassium: 4.1 mmol/L (ref 3.5–5.1)
Sodium: 128 mmol/L — ABNORMAL LOW (ref 135–145)

## 2018-05-18 LAB — FOLATE RBC
Folate, Hemolysate: >620 ng/mL
Folate, RBC: >2074 ng/mL (ref >498)
Hematocrit: 29.9 % — ABNORMAL LOW (ref 37.5–51.0)

## 2018-05-18 LAB — CBC
HCT: 26.9 % — ABNORMAL LOW (ref 39.0–52.0)
Hemoglobin: 8.9 g/dL — ABNORMAL LOW (ref 13.0–17.0)
MCH: 32 pg (ref 26.0–34.0)
MCHC: 33.1 g/dL (ref 30.0–36.0)
MCV: 96.8 fL (ref 78.0–100.0)
PLATELETS: 361 10*3/uL (ref 150–400)
RBC: 2.78 MIL/uL — ABNORMAL LOW (ref 4.22–5.81)
RDW: 14.6 % (ref 11.5–15.5)
WBC: 4.8 10*3/uL (ref 4.0–10.5)

## 2018-05-18 LAB — MAGNESIUM: Magnesium: 2.1 mg/dL (ref 1.7–2.4)

## 2018-05-18 LAB — PHOSPHORUS: PHOSPHORUS: 3.5 mg/dL (ref 2.5–4.6)

## 2018-05-18 NOTE — Progress Notes (Signed)
PROGRESS NOTE  Jack Mcintyre YIR:[id number] DOB: 10-25-1956 DOA: 05/16/2018 PCP: [facility]  HPI/Recap of past 24 hours: Jack Mcintyre is a 62 year old male with medical history significant for throat cancer with subsequent trach and PEG tube in 2015, alcohol abuse, presented to the ED with reports of abnormal labs.  Patient recently moved from [place] and had blood drawn first time at the [facility] clinic in [place] and was told to come to the ED due to unspecified abnormal labs.  Patient also reports a chronic cough, which she reports as worsening, no fever no chills, chronically short of breath which is unchanged.  Patient reports he is suctioning machine is broken and the VAC had to replace a new one.  Patient also reports right lower extremity foot dragging which is more persistent, no other neurologic symptoms.  Patient reports drinking vodka about 1 pint every 2 to 3 days and still smokes cigarettes.  Of note patient lives with his mother and moved from [place] to [facility] February of this year.  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  Patient mother reports a history of [place] lung infection in the past.  Patient admitted for pneumonia, was found to have hypo-natremia and possible AKI although baseline is unknown.  Still awaiting old medical records.  Today, patient reports feeling better, denies any worsening shortness of breath, cough, fever/chills, abdominal pain, nausea/vomiting, diarrhea.  Assessment/Plan: Principal Problem:   Pneumonia Active Problems:   Acute kidney injury (HCC)   Hyponatremia   Malnutrition of moderate degree  CAP/?Aspiration pneumonia Currently afebrile, no leukocytosis, on room air Chest x-ray showed right upper lobe opacity Urine strep pneumo negative BC x2 NGTD Sputum culture pending Continue ceftriaxone, azithromycin Frequent suctioning, aspiration precautions  AKI on CKD, unknown stage Reports history of one functioning  kidney Baseline creatinine unknown On presentation creatinine 1.97, currently 1.43 Status post IV fluids Daily BMP  Hyponatremia Sodium 127 on presentation Likely due to prerenal versus heavy alcohol consumption Monitor closely, daily BMP  Normocytic anemia/chronic kidney disease Component of dilution, hemoglobin 8.9, unknown baseline No evidence of bleeding Anemia panel: Iron 21, ferritin 165, saturation ratio 6.  Vitamin B12 819 Daily CBC  Abnormal gait Reports right foot dragging, ongoing for months CT head unremarkable MRI lumbar spine: Lumbar spondylosis greatest at the L3-4 and L4-5 levels with there is mild canal stenosis. No high-grade canal stenosis. Mild L3-4 and moderate L4-5 bilateral foraminal stenosis PT evaluated patient, no further needs  Alcohol abuse Advised to quit Continue CIWA protocol, multivitamins, thiamine, folic acid  History of throat cancer Status post PEG and trach in 2015 Nutrition on board  Moderate malnutrition Nutrition consulted     Code Status: Full  Family Communication: None at bedside  Disposition Plan: Home once stable, likely by 05/19/18.  Transfer to [place]   Consultants:  None  Procedures:  None  Antimicrobials:  Ceftriaxone  Azithromycin  DVT prophylaxis: Lovenox   Objective: Vitals:   05/18/18 0810 05/18/18 0840 05/18/18 1147 05/18/18 1148  BP: (!) 94/56     Pulse: (!) 103  75   Resp: (!) 33  19   Temp:    98.2 F (36.8 C)  TempSrc:    Oral  SpO2: 97%  95%   Weight:  69.5 kg (153 lb 3.5 oz)    Height:        Intake/Output Summary (Last 24 hours) at 05/18/2018 1421 Last data filed at 05/18/2018 1150 Gross per 24 hour  Intake 437  ml  Output 1250 ml  Net -813 ml   Filed Weights   05/16/18 1521 05/16/18 2353 05/18/18 0840  Weight: 69.4 kg (153 lb) 68.5 kg (151 lb 0.2 oz) 69.5 kg (153 lb 3.5 oz)    Exam:   General: NAD, thin  Cardiovascular: S1, S2 present  Respiratory: Diminished  breath sounds bilaterally, trach present  Abdomen: Soft, nontender, nondistended, bowel sounds present, PEG tube in place  Musculoskeletal: No edema bilaterally  Skin: Normal  Psychiatry: Normal mood   Data Reviewed: CBC: Recent Labs  Lab 05/16/18 1555 05/18/18 0657  WBC 6.9 4.8  HGB 10.3* 8.9*  HCT 29.6* 26.9*  MCV 96.4 96.8  PLT 407* 627   Basic Metabolic Panel: Recent Labs  Lab 05/16/18 1555 05/17/18 0329 05/17/18 1648 05/18/18 0657  NA 127* 127*  --  128*  K 4.4 4.0  --  4.1  CL 83* 86*  --  91*  CO2 28 27  --  27  GLUCOSE 166* 98  --  96  BUN 46* 44*  --  35*  CREATININE 1.97* 1.74*  --  1.43*  CALCIUM 9.4 9.1  --  8.9  MG  --  2.3 2.3 2.1  PHOS  --  4.0 3.6 3.5   GFR: Estimated Creatinine Clearance: 54 mL/min (A) (by C-G formula based on SCr of 1.43 mg/dL (H)). Liver Function Tests: Recent Labs  Lab 05/16/18 1554  AST 24  ALT 11*  ALKPHOS 60  BILITOT 0.5  PROT 8.6*  ALBUMIN 3.2*   No results for input(s): LIPASE, AMYLASE in the last 168 hours. No results for input(s): AMMONIA in the last 168 hours. Coagulation Profile: No results for input(s): INR, PROTIME in the last 168 hours. Cardiac Enzymes: No results for input(s): CKTOTAL, CKMB, CKMBINDEX, TROPONINI in the last 168 hours. BNP (last 3 results) No results for input(s): PROBNP in the last 8760 hours. HbA1C: No results for input(s): HGBA1C in the last 72 hours. CBG: Recent Labs  Lab 05/17/18 1937 05/18/18 0001 05/18/18 0341 05/18/18 0803 05/18/18 1144  GLUCAP 108* 171* 101* 96 109*   Lipid Profile: No results for input(s): CHOL, HDL, LDLCALC, TRIG, CHOLHDL, LDLDIRECT in the last 72 hours. Thyroid Function Tests: No results for input(s): TSH, T4TOTAL, FREET4, T3FREE, THYROIDAB in the last 72 hours. Anemia Panel: Recent Labs    05/17/18 0329  VITAMINB12 819  FERRITIN 165  TIBC 339  IRON 21*   Urine analysis: No results found for: COLORURINE, APPEARANCEUR, LABSPEC, PHURINE,  GLUCOSEU, HGBUR, BILIRUBINUR, KETONESUR, PROTEINUR, UROBILINOGEN, NITRITE, LEUKOCYTESUR Sepsis Labs: @LABRCNTIP (procalcitonin:4,lacticidven:4)  ) Recent Results (from the past 240 hour(s))  Culture, blood (routine x 2)     Status: None (Preliminary result)   Collection Time: 05/16/18  8:18 PM  Result Value Ref Range Status   Specimen Description   Final    BLOOD BLOOD LEFT HAND Performed at [facility] [address] [place], [place] [address]    Special Requests   Final    BOTTLES DRAWN AEROBIC AND ANAEROBIC Blood Culture adequate volume Performed at [facility] [address] [place], [place] [address]    Culture   Final    NO GROWTH < 24 HOURS Performed at [facility] [address] [place],  [address]    Report Status PENDING  Incomplete  Culture, blood (routine x 2)     Status: None (Preliminary result)   Collection Time: 05/16/18  8:18 PM  Result Value Ref  Range Status   Specimen Description   Final    BLOOD BLOOD RIGHT FOREARM Performed at [facility] [address] [place], [place] [address]    Special Requests   Final    BOTTLES DRAWN AEROBIC AND ANAEROBIC Blood Culture adequate volume Performed at [facility] [address] [place], [place] [address]    Culture   Final    NO GROWTH < 24 HOURS Performed at [facility] [address], [place], [place] [address]    Report Status PENDING  Incomplete  Culture, expectorated sputum-assessment     Status: None   Collection Time: 05/16/18 10:20 PM  Result Value Ref Range Status   Specimen Description SPU  Final   Special Requests NONE  Final   Sputum evaluation   Final    THIS SPECIMEN IS ACCEPTABLE FOR SPUTUM CULTURE Performed at [facility], [place] [address], [place], [place] [address]    Report Status 05/17/2018 FINAL  Final  Culture, respiratory (NON-Expectorated)     Status: None  (Preliminary result)   Collection Time: 05/16/18 10:20 PM  Result Value Ref Range Status   Specimen Description   Final    SPU Performed at [facility] [address], [place], [place] [address]    Special Requests   Final    NONE Reflexed from [id number] Performed at [facility], [place] [address], [place], [place] [address]    Gram Stain   Final    RARE WBC PRESENT, PREDOMINANTLY PMN FEW SQUAMOUS EPITHELIAL CELLS PRESENT MODERATE GRAM POSITIVE COCCI FEW GRAM POSITIVE RODS RARE GRAM NEGATIVE RODS    Culture FEW PSEUDOMONAS AERUGINOSA  Final   Report Status PENDING  Incomplete  MRSA PCR Screening     Status: None   Collection Time: 05/16/18 11:38 PM  Result Value Ref Range Status   MRSA by PCR NEGATIVE NEGATIVE Final    Comment:        The GeneXpert MRSA Assay (FDA approved for NASAL specimens only), is one component of a comprehensive MRSA colonization surveillance program. It is not intended to diagnose MRSA infection nor to guide or monitor treatment for MRSA infections. Performed at [facility], [place] [address] [place], [place] [address]       Studies: Mr Lumbar Spine W Wo Contrast  Result Date: 05/17/2018 CLINICAL DATA:  63 y/o M; right foot drop for 2 months. History of throat cancer. EXAM: MRI LUMBAR SPINE WITHOUT AND WITH CONTRAST TECHNIQUE: Multiplanar and multiecho pulse sequences of the lumbar spine were obtained without and with intravenous contrast. CONTRAST:  65mL MULTIHANCE GADOBENATE DIMEGLUMINE 529 MG/ML IV SOLN COMPARISON:  None. FINDINGS: Segmentation:  Standard. Alignment:  Physiologic. Vertebrae: L4 inferior endplate disc herniation with edema and enhancement within the L4 vertebral body and mildly increased T2 signal within the L4-5 intervertebral disc space, compatible with acute Schmorl's node. Partial sacroiliac joint fusion with fatty replacement of marrow along the margins of the joints,  probably sequelae of spondyloarthropathy or chronic sacroiliitis. Additionally, there are multilevel bridging osteophytes throughout the visible lumbar spine. No loss of vertebral body height or evidence for acute fracture. Conus medullaris and cauda equina: Conus extends to the L1 level. Conus and cauda equina appear normal. Paraspinal and other soft tissues: Right kidney atrophy. Disc levels: L1-2: No significant disc displacement, foraminal stenosis, or canal stenosis. L2-3: Mild disc bulge and facet hypertrophy. No significant foraminal or canal stenosis. L3-4: Moderate disc  bulge, endplate marginal osteophytes, and facet hypertrophy with mild bilateral foraminal and canal stenosis. L4-5: Moderate disc bulge, annular fissure in the right foraminal zone, and bilateral facet hypertrophy. Moderate foraminal stenosis. Mild canal stenosis. L5-S1: Mild disc bulge and facet hypertrophy. No significant foraminal or canal stenosis. IMPRESSION: 1. Acute L4 inferior endplate Schmorl's node with mild edema within the L4 vertebral body. 2. No loss of vertebral body height or acute fracture. 3. Partial fusion of sacroiliac joints with fatty replacement of adjacent bone marrow, probably chronic sequelae of spondyloarthropathy or sacroiliitis. 4. Lumbar spondylosis greatest at the L3-4 and L4-5 levels with there is mild canal stenosis. No high-grade canal stenosis. 5. Mild L3-4 and moderate L4-5 bilateral foraminal stenosis. Electronically Signed   By: Kristine Garbe M.D.   On: 05/17/2018 22:50    Scheduled Meds: . enoxaparin (LOVENOX) injection  40 mg Subcutaneous QHS  . feeding supplement (PRO-STAT SUGAR FREE 64)  30 mL Per Tube Daily  . folic acid  1 mg Oral Daily  . free water  200 mL Per Tube QID  . guaiFENesin  600 mg Oral BID  . multivitamin with minerals  1 tablet Oral Daily  . thiamine  100 mg Oral Daily  . TWOCAL HN  237 mL Per Tube QID    Continuous Infusions: . azithromycin Stopped (05/17/18  2239)  . cefTRIAXone (ROCEPHIN)  IV Stopped (05/17/18 1819)     LOS: 2 days     Alma Friendly, MD [facility]   If 7PM-7AM, please contact night-coverage [email or web address] Password [email or web address] 05/18/2018, 2:21 PM

## 2018-05-19 DIAGNOSIS — J151 Pneumonia due to Pseudomonas: Secondary | ICD-10-CM

## 2018-05-19 LAB — GLUCOSE, CAPILLARY
GLUCOSE-CAPILLARY: 88 mg/dL (ref 65–99)
GLUCOSE-CAPILLARY: 112 mg/dL — AB (ref 65–99)
Glucose-Capillary: 94 mg/dL (ref 65–99)

## 2018-05-19 LAB — CBC WITH DIFFERENTIAL/PLATELET
Basophils Absolute: 0 10*3/uL (ref 0.0–0.1)
Basophils Relative: 0 %
EOS PCT: 5 %
Eosinophils Absolute: 0.2 10*3/uL (ref 0.0–0.7)
HEMATOCRIT: 26.7 % — AB (ref 39.0–52.0)
Hemoglobin: 8.8 g/dL — ABNORMAL LOW (ref 13.0–17.0)
LYMPHS ABS: 0.6 10*3/uL — AB (ref 0.7–4.0)
LYMPHS PCT: 13 %
MCH: 32.2 pg (ref 26.0–34.0)
MCHC: 33 g/dL (ref 30.0–36.0)
MCV: 97.8 fL (ref 78.0–100.0)
MONO ABS: 0.6 10*3/uL (ref 0.1–1.0)
MONOS PCT: 12 %
NEUTROS ABS: 3.4 10*3/uL (ref 1.7–7.7)
Neutrophils Relative %: 70 %
PLATELETS: 394 10*3/uL (ref 150–400)
RBC: 2.73 MIL/uL — ABNORMAL LOW (ref 4.22–5.81)
RDW: 14.7 % (ref 11.5–15.5)
WBC: 4.9 10*3/uL (ref 4.0–10.5)

## 2018-05-19 LAB — BASIC METABOLIC PANEL
Anion gap: 8 (ref 5–15)
BUN: 30 mg/dL — AB (ref 6–20)
CALCIUM: 8.8 mg/dL — AB (ref 8.9–10.3)
CO2: 28 mmol/L (ref 22–32)
Chloride: 94 mmol/L — ABNORMAL LOW (ref 101–111)
Creatinine, Ser: 1.19 mg/dL (ref 0.61–1.24)
GFR calc Af Amer: >60 mL/min (ref >60)
GLUCOSE: 93 mg/dL (ref 65–99)
Potassium: 4.4 mmol/L (ref 3.5–5.1)
Sodium: 130 mmol/L — ABNORMAL LOW (ref 135–145)

## 2018-05-19 LAB — CULTURE, RESPIRATORY W GRAM STAIN

## 2018-05-19 LAB — CULTURE, RESPIRATORY

## 2018-05-19 MED ORDER — AMOXICILLIN-POT CLAVULANATE 875-125 MG PO TABS: 1.0000 | ORAL_TABLET | Freq: Two times a day (BID) | ORAL | 0 refills | Status: DC

## 2018-05-19 MED ORDER — THIAMINE HCL 100 MG PO TABS: 100.0000 mg | ORAL_TABLET | Freq: Every day | ORAL | 0 refills | Status: AC

## 2018-05-19 MED ORDER — FOLIC ACID 1 MG PO TABS: 1.0000 mg | ORAL_TABLET | Freq: Every day | ORAL | 0 refills | Status: AC

## 2018-05-19 MED ORDER — LEVOFLOXACIN 750 MG PO TABS: 750.0000 mg | ORAL_TABLET | Freq: Every day | ORAL | 0 refills | Status: AC

## 2018-05-19 MED ORDER — ADULT MULTIVITAMIN W/MINERALS CH: 1.0000 | ORAL_TABLET | Freq: Every day | ORAL | 0 refills | Status: AC

## 2018-05-19 NOTE — Progress Notes (Addendum)
AVS given to Pt and his mother he verbalized understanding  Of scripts and  Follow up appts.  1330 Pt d/c home with his mother ,

## 2018-05-19 NOTE — Discharge Summary (Signed)
Discharge Summary  Jack Mcintyre KPT:[id number] DOB: 18-May-1956  PCP: [facility]  Admit date: 05/16/2018 Discharge date: 05/19/2018  Time spent: 40 mins   Recommendations for Outpatient Follow-up:  1. PCP at [facility]  Discharge Diagnoses:  Active Hospital Problems   Diagnosis Date Noted  . Pneumonia 05/16/2018  . Malnutrition of moderate degree 05/17/2018  . Acute kidney injury ([place]) 05/16/2018  . Hyponatremia 05/16/2018    Resolved Hospital Problems  No resolved problems to display.    Discharge Condition: Stable   Diet recommendation: None  Vitals:   05/19/18 0754 05/19/18 1158  BP:    Pulse: 82 74  Resp: 18 17  Temp:    SpO2: 98% 99%    History of present illness:  Jack Mcintyre is a 62 year old male with medical history significant for throat cancer with subsequent trach and PEG tube in 2015, alcohol abuse, presented to the ED with reports of abnormal labs.  Patient recently moved from [place] and had blood drawn first time at the [facility] clinic in [place] and was told to come to the ED due to unspecified abnormal labs.  Patient also reports a chronic cough, which she reports as worsening, no fever no chills, chronically short of breath which is unchanged.  Patient reports he is suctioning machine is broken and the VAC had to replace a new one.  Patient also reports right lower extremity foot dragging which is more persistent, no other neurologic symptoms.  Patient reports drinking vodka about 1 pint every 2 to 3 days and still smokes cigarettes.  Of note patient lives with his mother and moved from [place] to [facility] February of this year.  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  Patient mother reports a history of [place] lung infection in the past.  Patient admitted for pneumonia, was found to have hypo-natremia and possible AKI although baseline is unknown.  Still awaiting old medical records.  Today, patient reports feeling better,  reports reduced cough/secretions, denies any worsening shortness of breath, fever/chills, abdominal pain, nausea/vomiting, diarrhea. Eager to go home.   Hospital Course:  Principal Problem:   Pneumonia Active Problems:   Acute kidney injury ([place])   Hyponatremia   Malnutrition of moderate degree  CAP/?Aspiration pneumonia Currently afebrile, no leukocytosis, on room air Chest x-ray showed right upper lobe opacity Urine strep pneumo negative BC x2 NGTD Sputum culture shows few pseudomonas aurigenosa  Switch to PO levaquin for a total of 7 days Frequent suctioning, aspiration precautions Follow up with PCP  AKI on CKD, unknown stage Improved Reports history of one functioning kidney Baseline creatinine unknown On presentation creatinine 1.97, currently 1.19 Status post IV fluids Follow up with PCP, with repeat labs  Hyponatremia Improved Sodium 127 on presentation, now 130 Likely due to prerenal versus heavy alcohol consumption Follow up with PCP, with repeat labs  Normocytic anemia/chronic kidney disease Component of dilution, hemoglobin 8.9, unknown baseline No evidence of bleeding Anemia panel:Iron 21, ferritin 165, saturation ratio 6.Vitamin B12 819 Follow up with PCP  Abnormal gait Reports right foot dragging, ongoing for months CT head unremarkable MRI lumbar spine: Lumbar spondylosis greatest at the L3-4 and L4-5 levels with there is mild canal stenosis. No high-grade canal stenosis. Mild L3-4 and moderate L4-5 bilateral foraminal stenosis PT evaluated patient, no further needs  Alcohol abuse Advised to quit Discharged on multivitamins, thiamine, folic acid  History of throat cancer Status post PEG and trach in 2015 Follow up at the [facility]  Moderate  malnutrition Nutrition consulted, educated pt about nutritional requirements Pt capable of self tube feeds    Procedures:   None  Consultations:  None  Discharge Exam: BP 108/69 (BP Location:  Right Arm)   Pulse 74   Temp 98.3 F (36.8 C) (Oral)   Resp 17   Ht 6' (1.829 m)   Wt 70.2 kg (154 lb 12.8 oz)   SpO2 99%   BMI 20.99 kg/m   General: NAD  Cardiovascular: S1, S2 present Respiratory: Diminished BS bilaterally, trach present   Discharge Instructions You were cared for by a hospitalist during your hospital stay. If you have any questions about your discharge medications or the care you received while you were in the hospital after you are discharged, you can call the unit and asked to speak with the hospitalist on call if the hospitalist that took care of you is not available. Once you are discharged, your primary care physician will handle any further medical issues. Please note that NO REFILLS for any discharge medications will be authorized once you are discharged, as it is imperative that you return to your primary care physician (or establish a relationship with a primary care physician if you do not have one) for your aftercare needs so that they can reassess your need for medications and monitor your lab values.  Discharge Instructions    Diet - low sodium heart healthy   Complete by:  As directed    Increase activity slowly   Complete by:  As directed      Allergies as of 05/19/2018   No Known Allergies     Medication List    TAKE these medications   folic acid 1 MG tablet Commonly known as:  FOLVITE Take 1 tablet (1 mg total) by mouth daily. Start taking on:  05/20/2018   levofloxacin 750 MG tablet Commonly known as:  LEVAQUIN Take 1 tablet (750 mg total) by mouth daily for 5 days.   multivitamin with minerals Tabs tablet Take 1 tablet by mouth daily. Start taking on:  05/20/2018   pseudoephedrine-guaifenesin 60-600 MG 12 hr tablet Commonly known as:  MUCINEX D Take 1 tablet by mouth every 12 (twelve) hours as needed for congestion.   thiamine 100 MG tablet Take 1 tablet (100 mg total) by mouth daily. Start taking on:  05/20/2018      No Known  Allergies Follow-up Information    [facility]. Schedule an appointment as soon as possible for a visit in 1 week(s).   Contact information: [place] [address] [phone]            The results of significant diagnostics from this hospitalization (including imaging, microbiology, ancillary and laboratory) are listed below for reference.    Significant Diagnostic Studies: Dg Chest 2 View  Result Date: 05/16/2018 CLINICAL DATA:  Smoker. 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 Patient with no acute complaints. EXAM: CHEST - 2 VIEW COMPARISON:  Chest x-rays since January 11, 2006 FINDINGS: Surgical changes seen in the left upper lobe. Mild left perihilar opacity. The masslike opacity in the right upper lobe on the previous study has resolved. There is scattered patchy opacity in the right upper lobe and right perihilar region. Tracheostomy tube is identified. No pneumothorax. The cardiomediastinal silhouette is unremarkable. IMPRESSION: 1. There is patchy opacity in the right upper lobe and right perihilar region. Given the previous cavitary lesion in the right upper lobe in 2007 which has largely  resolve, the opacity in the right lung today could represent either scarring, infiltrate/pneumonia, or a combination of scarring and infiltrate. Recommend clinical correlation. 2. Mild infiltrate in left perihilar region as well. 3. Recommend clinical correlation and follow-up to resolution. Electronically Signed   By: Dorise Bullion III M.D   On: 05/16/2018 19:17   Ct Head Wo Contrast  Result Date: 05/16/2018 CLINICAL DATA:  62 year old male with neurologic deficit (not otherwise specified at the time of this report). EXAM: CT HEAD WITHOUT CONTRAST TECHNIQUE: Contiguous axial images were obtained from the base of the skull through the vertex without intravenous contrast. COMPARISON:  PET-CT 10/30/2004 FINDINGS: Brain: No midline  shift, ventriculomegaly, mass effect, evidence of mass lesion, intracranial hemorrhage or evidence of cortically based acute infarction. Gray-white matter differentiation is within normal limits throughout the brain. Cerebral volume appears at the lower limits of normal to decreased for age in a generalized fashion. No cortical encephalomalacia identified. Vascular: Calcified atherosclerosis at the skull base. No suspicious intracranial vascular hyperdensity. Skull: Negative. Sinuses/Orbits: Visualized paranasal sinuses and mastoids are stable and well pneumatized. Other: Visualized orbits and scalp soft tissues are within normal limits. IMPRESSION: Negative for age noncontrast Head CT. Electronically Signed   By: Genevie Ann M.D.   On: 05/16/2018 18:59   Mr Lumbar Spine W Wo Contrast  Result Date: 05/17/2018 CLINICAL DATA:  62 y/o M; right foot drop for 2 months. History of throat cancer. EXAM: MRI LUMBAR SPINE WITHOUT AND WITH CONTRAST TECHNIQUE: Multiplanar and multiecho pulse sequences of the lumbar spine were obtained without and with intravenous contrast. CONTRAST:  61mL MULTIHANCE GADOBENATE DIMEGLUMINE 529 MG/ML IV SOLN COMPARISON:  None. FINDINGS: Segmentation:  Standard. Alignment:  Physiologic. Vertebrae: L4 inferior endplate disc herniation with edema and enhancement within the L4 vertebral body and mildly increased T2 signal within the L4-5 intervertebral disc space, compatible with acute Schmorl's node. Partial sacroiliac joint fusion with fatty replacement of marrow along the margins of the joints, probably sequelae of spondyloarthropathy or chronic sacroiliitis. Additionally, there are multilevel bridging osteophytes throughout the visible lumbar spine. No loss of vertebral body height or evidence for acute fracture. Conus medullaris and cauda equina: Conus extends to the L1 level. Conus and cauda equina appear normal. Paraspinal and other soft tissues: Right kidney atrophy. Disc levels: L1-2: No  significant disc displacement, foraminal stenosis, or canal stenosis. L2-3: Mild disc bulge and facet hypertrophy. No significant foraminal or canal stenosis. L3-4: Moderate disc bulge, endplate marginal osteophytes, and facet hypertrophy with mild bilateral foraminal and canal stenosis. L4-5: Moderate disc bulge, annular fissure in the right foraminal zone, and bilateral facet hypertrophy. Moderate foraminal stenosis. Mild canal stenosis. L5-S1: Mild disc bulge and facet hypertrophy. No significant foraminal or canal stenosis. IMPRESSION: 1. Acute L4 inferior endplate Schmorl's node with mild edema within the L4 vertebral body. 2. No loss of vertebral body height or acute fracture. 3. Partial fusion of sacroiliac joints with fatty replacement of adjacent bone marrow, probably chronic sequelae of spondyloarthropathy or sacroiliitis. 4. Lumbar spondylosis greatest at the L3-4 and L4-5 levels with there is mild canal stenosis. No high-grade canal stenosis. 5. Mild L3-4 and moderate L4-5 bilateral foraminal stenosis. Electronically Signed   By: Kristine Garbe M.D.   On: 05/17/2018 22:50    Microbiology: Recent Results (from the past 240 hour(s))  Culture, blood (routine x 2)     Status: None (Preliminary result)   Collection Time: 05/16/18  8:18 PM  Result Value Ref Range Status   Specimen  Description   Final    BLOOD BLOOD LEFT HAND Performed at [facility] [address] [place], [place] [address]    Special Requests   Final    BOTTLES DRAWN AEROBIC AND ANAEROBIC Blood Culture adequate volume Performed at [facility] [address] [place], [place] [address]    Culture   Final    NO GROWTH 3 DAYS Performed at [facility], [place] [address] [place], [place] [address]    Report Status PENDING  Incomplete  Culture, blood (routine x 2)     Status: None (Preliminary result)   Collection Time: 05/16/18  8:18 PM  Result Value Ref Range Status     Specimen Description   Final    BLOOD BLOOD RIGHT FOREARM Performed at [facility] [address] [place], [place] [address]    Special Requests   Final    BOTTLES DRAWN AEROBIC AND ANAEROBIC Blood Culture adequate volume Performed at [facility] [address], [place], [place] [address]    Culture   Final    NO GROWTH 3 DAYS Performed at [facility], [place] [address] [place], [place] [address]    Report Status PENDING  Incomplete  Culture, expectorated sputum-assessment     Status: None   Collection Time: 05/16/18 10:20 PM  Result Value Ref Range Status   Specimen Description SPU  Final   Special Requests NONE  Final   Sputum evaluation   Final    THIS SPECIMEN IS ACCEPTABLE FOR SPUTUM CULTURE Performed at [facility], [place] [address] [place], [place] [address]    Report Status 05/17/2018 FINAL  Final  Culture, respiratory (NON-Expectorated)     Status: None   Collection Time: 05/16/18 10:20 PM  Result Value Ref Range Status   Specimen Description   Final    SPU Performed at [facility] [address], [place], [place] [address]    Special Requests   Final    NONE Reflexed from [id number] Performed at [facility], [place] [address], [place], [place] [address]    Gram Stain   Final    RARE WBC PRESENT, PREDOMINANTLY PMN FEW SQUAMOUS EPITHELIAL CELLS PRESENT MODERATE GRAM POSITIVE COCCI FEW GRAM POSITIVE RODS RARE GRAM NEGATIVE RODS    Culture FEW PSEUDOMONAS AERUGINOSA  Final   Report Status 05/19/2018 FINAL  Final  MRSA PCR Screening     Status: None   Collection Time: 05/16/18 11:38 PM  Result Value Ref Range Status   MRSA by PCR NEGATIVE NEGATIVE Final    Comment:        The GeneXpert MRSA Assay (FDA approved for NASAL specimens only), is one component of a comprehensive MRSA colonization surveillance program. It is not intended to diagnose MRSA infection  nor to guide or monitor treatment for MRSA infections. Performed at [facility], [place] [address], [place]  [address]      Labs: Basic Metabolic Panel: Recent Labs  Lab 05/16/18 1555 05/17/18 0329 05/17/18 1648 05/18/18 0657 05/19/18 0353  NA 127* 127*  --  128* 130*  K 4.4 4.0  --  4.1 4.4  CL 83* 86*  --  91* 94*  CO2 28 27  --  27 28  GLUCOSE 166* 98  --  96 93  BUN 46* 44*  --  35* 30*  CREATININE 1.97* 1.74*  --  1.43* 1.19  CALCIUM 9.4  9.1  --  8.9 8.8*  MG  --  2.3 2.3 2.1  --   PHOS  --  4.0 3.6 3.5  --    Liver Function Tests: Recent Labs  Lab 05/16/18 1554  AST 24  ALT 11*  ALKPHOS 60  BILITOT 0.5  PROT 8.6*  ALBUMIN 3.2*   No results for input(s): LIPASE, AMYLASE in the last 168 hours. No results for input(s): AMMONIA in the last 168 hours. CBC: Recent Labs  Lab 05/16/18 1555 05/17/18 0329 05/18/18 0657 05/19/18 0353  WBC 6.9  --  4.8 4.9  NEUTROABS  --   --   --  3.4  HGB 10.3*  --  8.9* 8.8*  HCT 29.6* 29.9* 26.9* 26.7*  MCV 96.4  --  96.8 97.8  PLT 407*  --  361 394   Cardiac Enzymes: No results for input(s): CKTOTAL, CKMB, CKMBINDEX, TROPONINI in the last 168 hours. BNP: BNP (last 3 results) No results for input(s): BNP in the last 8760 hours.  ProBNP (last 3 results) No results for input(s): PROBNP in the last 8760 hours.  CBG: Recent Labs  Lab 05/18/18 2007 05/18/18 2310 05/19/18 0353 05/19/18 0754 05/19/18 1154  GLUCAP 111* 109* 94 88 112*       Signed:  Alma Friendly, MD [facility] 05/19/2018, 12:00 PM

## 2018-05-21 LAB — CULTURE, BLOOD (ROUTINE X 2)
Culture: NO GROWTH
Culture: NO GROWTH
SPECIAL REQUESTS: ADEQUATE
SPECIAL REQUESTS: ADEQUATE

## 2018-07-21 ENCOUNTER — Institutional Professional Consult (permissible substitution): Payer: No Typology Code available for payment source | Admitting: Pulmonary Disease

## 2018-08-22 ENCOUNTER — Institutional Professional Consult (permissible substitution): Payer: No Typology Code available for payment source | Admitting: Pulmonary Disease

## 2018-09-01 ENCOUNTER — Encounter: Payer: Self-pay | Admitting: Pulmonary Disease

## 2018-09-01 ENCOUNTER — Institutional Professional Consult (permissible substitution): Payer: No Typology Code available for payment source | Admitting: Pulmonary Disease

## 2018-09-01 ENCOUNTER — Ambulatory Visit (INDEPENDENT_AMBULATORY_CARE_PROVIDER_SITE_OTHER): Payer: No Typology Code available for payment source | Admitting: Pulmonary Disease

## 2018-09-01 VITALS — BP 104/62 | HR 82 | Ht 73.0 in | Wt 148.2 lb

## 2018-09-01 DIAGNOSIS — R935 Abnormal findings on diagnostic imaging of other abdominal regions, including retroperitoneum: Secondary | ICD-10-CM | POA: Diagnosis not present

## 2018-09-01 DIAGNOSIS — J479 Bronchiectasis, uncomplicated: Secondary | ICD-10-CM | POA: Diagnosis not present

## 2018-09-01 NOTE — Patient Instructions (Addendum)
History of throat cancer Permanent tracheostomy  Recent pneumonia with increased mucus production, shortness of breath Abnormal chest x-ray, previously significantly abnormal CT scan of the chest  We will obtain a CT scan of the chest Obtain sputum for Gram stain and cultures  I will see him back in the office in about 4 to 6 weeks Call if any changes in status

## 2018-09-01 NOTE — Progress Notes (Signed)
Jack Mcintyre    [id number]    28-Oct-1956  Primary Care Physician:[facility]  Referring Physician: [facility] [address] [place], [place] [address]  Chief complaint:   Shortness of breath Mucus production Recent treatment for pneumonia  HPI:  Patient with a history of throat cancer for which he had surgery and radiation treatment He has a trach in place He has a PEG in place He is able to taking orally No significant hemoptysis, He has a chronic cough bringing up yellowish mucus in the morning and clear as the day progresses Denies any fevers or chills No chest pains or discomfort  He is an active smoker, about 10 sticks a day Uses alcohol regularly  He has no weight loss Not on any inhalers    Occupation: No pertinent occupational history Exposures: No significant exposure Smoking history: An active smoker  Outpatient Encounter Medications as of 09/01/2018  Medication Sig  . cholecalciferol (VITAMIN D) 1000 units tablet Take 1,000 Units by mouth daily.  . folic acid (FOLVITE) 1 MG tablet Take 1 mg by mouth daily.  . Multiple Vitamin (MULTIVITAMIN) tablet Take 1 tablet by mouth daily.  . pseudoephedrine-guaifenesin (MUCINEX D) 60-600 MG 12 hr tablet Take 1 tablet by mouth every 12 (twelve) hours as needed for congestion.   No facility-administered encounter medications on file as of 09/01/2018.     Allergies as of 09/01/2018  . (No Known Allergies)    Past Medical History:  Diagnosis Date  . Cancer [facility])     No family history on file.  Social History   Socioeconomic History  . Marital status: Single    Spouse name: Not on file  . Number of children: Not on file  . Years of education: Not on file  . Highest education level: Not on file  Occupational History  . Not on file  Social Needs  . Financial resource strain: Not on file  . Food insecurity:    Worry: Not on file    Inability: Not on file  .  Transportation needs:    Medical: Not on file    Non-medical: Not on file  Tobacco Use  . Smoking status: Current Every Day Smoker    Packs/day: 0.50    Years: 20.00    Pack years: 10.00    Types: Cigarettes  . Tobacco comment: uses vape when out of cigarettes  Substance and Sexual Activity  . Alcohol use: Not on file  . Drug use: Not on file  . Sexual activity: Not on file  Lifestyle  . Physical activity:    Days per week: Not on file    Minutes per session: Not on file  . Stress: Not on file  Relationships  . Social connections:    Talks on phone: Not on file    Gets together: Not on file    Attends religious service: Not on file    Active member of club or organization: Not on file    Attends meetings of clubs or organizations: Not on file    Relationship status: Not on file  . Intimate partner violence:    Fear of current or ex partner: Not on file    Emotionally abused: Not on file    Physically abused: Not on file    Forced sexual activity: Not on file  Other Topics Concern  . Not on file  Social History Narrative  . Not on file  Review of Systems  Constitutional: Positive for fatigue.  HENT: Negative.   Eyes: Negative.   Respiratory: Positive for cough and shortness of breath.   Cardiovascular: Negative.   Endocrine: Negative.   Genitourinary: Negative.   Musculoskeletal: Negative.   Allergic/Immunologic: Negative.   Neurological: Negative.   Hematological: Negative.   All other systems reviewed and are negative.   Vitals:   09/01/18 0936  BP: 104/62  Pulse: 82  SpO2: 100%     Physical Exam  Constitutional: He is oriented to person, place, and time. He appears well-developed and well-nourished.  HENT:  Head: Normocephalic and atraumatic.  Eyes: Pupils are equal, round, and reactive to light. Conjunctivae and EOM are normal. Right eye exhibits no discharge. Left eye exhibits no discharge.  Neck: Normal range of motion. Neck supple. No tracheal  deviation present. No thyromegaly present.  Cardiovascular: Normal rate and regular rhythm.  Pulmonary/Chest: Effort normal and breath sounds normal. No respiratory distress. He has no wheezes.  Abdominal: Soft. Bowel sounds are normal. He exhibits no distension. There is no tenderness.  Musculoskeletal: Normal range of motion. He exhibits no edema or deformity.  Neurological: He is alert and oriented to person, place, and time. He has normal reflexes. No cranial nerve deficit.  Skin: Skin is warm and dry. No erythema.  Psychiatric: He has a normal mood and affect. His behavior is normal.   Data Reviewed: Previous chest CT-2007 Recent chest x-ray from May reviewed  Assessment:   History of throat cancer status post radiation treatment and surgery  Shortness of breath-chronic problem, little worse recently since his recent infection  Recent pneumonia-sputum production is better, clear mucoid mucus Not having any fevers or chills  Active smoker-half pack a day smoker  History of bronchiectasis  Plan/Recommendations:  We will obtain a CT scan of the chest to assess previous changes and also to follow-up on his recent pneumonia  We will obtain sputum for Gram stain and cultures  He has no fevers or chills at present, will hold off on antibiotic therapy  There is a possibility of chronic colonization as well  Smoking cessation counseling, counseling for his alcohol use  I will see him back in the office in about 4 to 6 weeks Encouraged to call if any significant changes in symptoms  Sherrilyn Rist MD [facility] 09/01/2018, 10:05 AM  CC: [place]

## 2018-09-07 ENCOUNTER — Other Ambulatory Visit: Payer: No Typology Code available for payment source

## 2018-09-07 ENCOUNTER — Ambulatory Visit (HOSPITAL_COMMUNITY)
Admission: RE | Admit: 2018-09-07 | Discharge: 2018-09-07 | Disposition: A | Discharge status: Home / Self Care | Payer: Non-veteran care | Source: Ambulatory Visit | Attending: Pulmonary Disease | Admitting: Pulmonary Disease

## 2018-09-07 DIAGNOSIS — J479 Bronchiectasis, uncomplicated: Secondary | ICD-10-CM

## 2018-09-07 DIAGNOSIS — J439 Emphysema, unspecified: Secondary | ICD-10-CM | POA: Insufficient documentation

## 2018-09-07 DIAGNOSIS — I2510 Atherosclerotic heart disease of native coronary artery without angina pectoris: Secondary | ICD-10-CM | POA: Diagnosis not present

## 2018-09-07 DIAGNOSIS — I700 Atherosclerosis of aorta: Secondary | ICD-10-CM | POA: Diagnosis not present

## 2018-09-07 DIAGNOSIS — R935 Abnormal findings on diagnostic imaging of other abdominal regions, including retroperitoneum: Secondary | ICD-10-CM | POA: Diagnosis present

## 2018-09-07 DIAGNOSIS — R911 Solitary pulmonary nodule: Secondary | ICD-10-CM | POA: Diagnosis not present

## 2018-09-09 LAB — RESPIRATORY CULTURE OR RESPIRATORY AND SPUTUM CULTURE: MICRO NUMBER:: 91126406

## 2018-09-26 ENCOUNTER — Institutional Professional Consult (permissible substitution): Payer: No Typology Code available for payment source | Admitting: Pulmonary Disease

## 2018-10-11 ENCOUNTER — Ambulatory Visit: Payer: No Typology Code available for payment source | Admitting: Pulmonary Disease

## 2018-10-26 ENCOUNTER — Ambulatory Visit: Payer: No Typology Code available for payment source | Admitting: Pulmonary Disease

## 2019-09-21 ENCOUNTER — Inpatient Hospital Stay (HOSPITAL_COMMUNITY)
Admission: EM | Admit: 2019-09-21 | Discharge: 2019-10-01 | DRG: 178 | Disposition: A | Discharge status: Hospice | Payer: Medicaid Other | Attending: Internal Medicine | Admitting: Internal Medicine

## 2019-09-21 ENCOUNTER — Other Ambulatory Visit: Payer: Self-pay

## 2019-09-21 ENCOUNTER — Emergency Department (HOSPITAL_COMMUNITY): Payer: Medicaid Other

## 2019-09-21 DIAGNOSIS — R443 Hallucinations, unspecified: Secondary | ICD-10-CM | POA: Diagnosis not present

## 2019-09-21 DIAGNOSIS — Z930 Tracheostomy status: Secondary | ICD-10-CM | POA: Diagnosis not present

## 2019-09-21 DIAGNOSIS — J189 Pneumonia, unspecified organism: Secondary | ICD-10-CM

## 2019-09-21 DIAGNOSIS — Z66 Do not resuscitate: Secondary | ICD-10-CM | POA: Diagnosis not present

## 2019-09-21 DIAGNOSIS — G9340 Encephalopathy, unspecified: Secondary | ICD-10-CM | POA: Diagnosis present

## 2019-09-21 DIAGNOSIS — E440 Moderate protein-calorie malnutrition: Secondary | ICD-10-CM | POA: Diagnosis present

## 2019-09-21 DIAGNOSIS — R5383 Other fatigue: Secondary | ICD-10-CM | POA: Diagnosis present

## 2019-09-21 DIAGNOSIS — R531 Weakness: Secondary | ICD-10-CM | POA: Diagnosis not present

## 2019-09-21 DIAGNOSIS — Z681 Body mass index (BMI) 19 or less, adult: Secondary | ICD-10-CM | POA: Diagnosis not present

## 2019-09-21 DIAGNOSIS — F1010 Alcohol abuse, uncomplicated: Secondary | ICD-10-CM | POA: Diagnosis present

## 2019-09-21 DIAGNOSIS — I9589 Other hypotension: Secondary | ICD-10-CM | POA: Diagnosis present

## 2019-09-21 DIAGNOSIS — R911 Solitary pulmonary nodule: Secondary | ICD-10-CM | POA: Diagnosis present

## 2019-09-21 DIAGNOSIS — R627 Adult failure to thrive: Secondary | ICD-10-CM | POA: Diagnosis present

## 2019-09-21 DIAGNOSIS — Z9221 Personal history of antineoplastic chemotherapy: Secondary | ICD-10-CM

## 2019-09-21 DIAGNOSIS — N179 Acute kidney failure, unspecified: Secondary | ICD-10-CM | POA: Diagnosis present

## 2019-09-21 DIAGNOSIS — Z85819 Personal history of malignant neoplasm of unspecified site of lip, oral cavity, and pharynx: Secondary | ICD-10-CM

## 2019-09-21 DIAGNOSIS — K7031 Alcoholic cirrhosis of liver with ascites: Secondary | ICD-10-CM

## 2019-09-21 DIAGNOSIS — Z23 Encounter for immunization: Secondary | ICD-10-CM

## 2019-09-21 DIAGNOSIS — J15212 Pneumonia due to Methicillin resistant Staphylococcus aureus: Secondary | ICD-10-CM | POA: Diagnosis present

## 2019-09-21 DIAGNOSIS — J9610 Chronic respiratory failure, unspecified whether with hypoxia or hypercapnia: Secondary | ICD-10-CM | POA: Diagnosis present

## 2019-09-21 DIAGNOSIS — E876 Hypokalemia: Secondary | ICD-10-CM | POA: Diagnosis present

## 2019-09-21 DIAGNOSIS — D649 Anemia, unspecified: Secondary | ICD-10-CM | POA: Diagnosis not present

## 2019-09-21 DIAGNOSIS — C140 Malignant neoplasm of pharynx, unspecified: Secondary | ICD-10-CM | POA: Diagnosis present

## 2019-09-21 DIAGNOSIS — R0602 Shortness of breath: Secondary | ICD-10-CM

## 2019-09-21 DIAGNOSIS — A419 Sepsis, unspecified organism: Secondary | ICD-10-CM | POA: Diagnosis not present

## 2019-09-21 DIAGNOSIS — E162 Hypoglycemia, unspecified: Secondary | ICD-10-CM | POA: Diagnosis present

## 2019-09-21 DIAGNOSIS — Z20828 Contact with and (suspected) exposure to other viral communicable diseases: Secondary | ICD-10-CM | POA: Diagnosis present

## 2019-09-21 DIAGNOSIS — F17210 Nicotine dependence, cigarettes, uncomplicated: Secondary | ICD-10-CM | POA: Diagnosis present

## 2019-09-21 DIAGNOSIS — R64 Cachexia: Secondary | ICD-10-CM | POA: Diagnosis present

## 2019-09-21 DIAGNOSIS — Z7189 Other specified counseling: Secondary | ICD-10-CM

## 2019-09-21 DIAGNOSIS — K3189 Other diseases of stomach and duodenum: Secondary | ICD-10-CM | POA: Diagnosis present

## 2019-09-21 DIAGNOSIS — F1020 Alcohol dependence, uncomplicated: Secondary | ICD-10-CM | POA: Diagnosis present

## 2019-09-21 DIAGNOSIS — R188 Other ascites: Secondary | ICD-10-CM

## 2019-09-21 DIAGNOSIS — Z79890 Hormone replacement therapy (postmenopausal): Secondary | ICD-10-CM

## 2019-09-21 DIAGNOSIS — R1310 Dysphagia, unspecified: Secondary | ICD-10-CM | POA: Diagnosis not present

## 2019-09-21 DIAGNOSIS — J690 Pneumonitis due to inhalation of food and vomit: Secondary | ICD-10-CM | POA: Diagnosis present

## 2019-09-21 DIAGNOSIS — J151 Pneumonia due to Pseudomonas: Secondary | ICD-10-CM | POA: Diagnosis not present

## 2019-09-21 DIAGNOSIS — R1313 Dysphagia, pharyngeal phase: Secondary | ICD-10-CM | POA: Diagnosis present

## 2019-09-21 DIAGNOSIS — R6521 Severe sepsis with septic shock: Secondary | ICD-10-CM | POA: Diagnosis not present

## 2019-09-21 DIAGNOSIS — Z8249 Family history of ischemic heart disease and other diseases of the circulatory system: Secondary | ICD-10-CM

## 2019-09-21 DIAGNOSIS — E860 Dehydration: Secondary | ICD-10-CM

## 2019-09-21 DIAGNOSIS — R441 Visual hallucinations: Secondary | ICD-10-CM | POA: Diagnosis not present

## 2019-09-21 DIAGNOSIS — Z431 Encounter for attention to gastrostomy: Secondary | ICD-10-CM

## 2019-09-21 DIAGNOSIS — D696 Thrombocytopenia, unspecified: Secondary | ICD-10-CM | POA: Diagnosis present

## 2019-09-21 DIAGNOSIS — K760 Fatty (change of) liver, not elsewhere classified: Secondary | ICD-10-CM | POA: Diagnosis present

## 2019-09-21 DIAGNOSIS — Z79899 Other long term (current) drug therapy: Secondary | ICD-10-CM

## 2019-09-21 DIAGNOSIS — R579 Shock, unspecified: Secondary | ICD-10-CM | POA: Diagnosis not present

## 2019-09-21 DIAGNOSIS — Z923 Personal history of irradiation: Secondary | ICD-10-CM

## 2019-09-21 DIAGNOSIS — Z978 Presence of other specified devices: Secondary | ICD-10-CM

## 2019-09-21 DIAGNOSIS — E039 Hypothyroidism, unspecified: Secondary | ICD-10-CM | POA: Diagnosis present

## 2019-09-21 DIAGNOSIS — Z515 Encounter for palliative care: Secondary | ICD-10-CM | POA: Diagnosis not present

## 2019-09-21 DIAGNOSIS — K7011 Alcoholic hepatitis with ascites: Secondary | ICD-10-CM | POA: Diagnosis not present

## 2019-09-21 LAB — SARS CORONAVIRUS 2 BY RT PCR (HOSPITAL ORDER, PERFORMED IN ~~LOC~~ HOSPITAL LAB): SARS Coronavirus 2: NEGATIVE

## 2019-09-21 LAB — POC OCCULT BLOOD, ED: Fecal Occult Bld: NEGATIVE

## 2019-09-21 LAB — ETHANOL: Alcohol, Ethyl (B): <10 mg/dL (ref <10)

## 2019-09-21 LAB — COMPREHENSIVE METABOLIC PANEL
ALT: 13 U/L (ref 0–44)
AST: 39 U/L (ref 15–41)
Albumin: 2.5 g/dL — ABNORMAL LOW (ref 3.5–5.0)
Alkaline Phosphatase: 66 U/L (ref 38–126)
Anion gap: 10 (ref 5–15)
BUN: 13 mg/dL (ref 8–23)
CO2: 27 mmol/L (ref 22–32)
Calcium: 8.9 mg/dL (ref 8.9–10.3)
Chloride: 107 mmol/L (ref 98–111)
Creatinine, Ser: 1.67 mg/dL — ABNORMAL HIGH (ref 0.61–1.24)
GFR calc Af Amer: 50 mL/min — ABNORMAL LOW (ref >60)
GFR calc non Af Amer: 43 mL/min — ABNORMAL LOW (ref >60)
Glucose, Bld: 74 mg/dL (ref 70–99)
Potassium: 3.2 mmol/L — ABNORMAL LOW (ref 3.5–5.1)
Sodium: 144 mmol/L (ref 135–145)
Total Bilirubin: 0.8 mg/dL (ref 0.3–1.2)
Total Protein: 6.4 g/dL — ABNORMAL LOW (ref 6.5–8.1)

## 2019-09-21 LAB — CBG MONITORING, ED: Glucose-Capillary: 66 mg/dL — ABNORMAL LOW (ref 70–99)

## 2019-09-21 LAB — BRAIN NATRIURETIC PEPTIDE: B Natriuretic Peptide: 185.4 pg/mL — ABNORMAL HIGH (ref 0.0–100.0)

## 2019-09-21 LAB — LACTIC ACID, PLASMA
Lactic Acid, Venous: 1.1 mmol/L (ref 0.5–1.9)
Lactic Acid, Venous: 0.9 mmol/L (ref 0.5–1.9)

## 2019-09-21 LAB — PREPARE RBC (CROSSMATCH)

## 2019-09-21 LAB — CBC
HCT: 21.5 % — ABNORMAL LOW (ref 39.0–52.0)
Hemoglobin: 7.2 g/dL — ABNORMAL LOW (ref 13.0–17.0)
MCH: 34.6 pg — ABNORMAL HIGH (ref 26.0–34.0)
MCHC: 33.5 g/dL (ref 30.0–36.0)
MCV: 103.4 fL — ABNORMAL HIGH (ref 80.0–100.0)
Platelets: 105 10*3/uL — ABNORMAL LOW (ref 150–400)
RBC: 2.08 MIL/uL — ABNORMAL LOW (ref 4.22–5.81)
RDW: 18.7 % — ABNORMAL HIGH (ref 11.5–15.5)
WBC: 2.7 10*3/uL — ABNORMAL LOW (ref 4.0–10.5)
nRBC: 0 % (ref 0.0–0.2)

## 2019-09-21 LAB — TROPONIN I (HIGH SENSITIVITY)
Troponin I (High Sensitivity): 17 ng/L (ref <18)
Troponin I (High Sensitivity): 19 ng/L — ABNORMAL HIGH (ref <18)

## 2019-09-21 LAB — LIPASE, BLOOD: Lipase: 36 U/L (ref 11–51)

## 2019-09-21 LAB — AMMONIA: Ammonia: 17 umol/L (ref 9–35)

## 2019-09-21 LAB — MAGNESIUM: Magnesium: 1.7 mg/dL (ref 1.7–2.4)

## 2019-09-21 MED ORDER — SODIUM CHLORIDE 0.9 % IV SOLN
10.0000 mL/h | Freq: Once | INTRAVENOUS | Status: AC
  Administered 2019-09-21: 10 mL/h via INTRAVENOUS

## 2019-09-21 MED ORDER — SODIUM CHLORIDE 0.9% FLUSH: 3.0000 mL | Freq: Once | INTRAVENOUS | Status: DC

## 2019-09-21 MED ORDER — SODIUM CHLORIDE 0.9 % IV SOLN
3.0000 g | Freq: Once | INTRAVENOUS | Status: AC
  Administered 2019-09-21: 3 g via INTRAVENOUS
  Filled 2019-09-21: qty 8

## 2019-09-21 MED ORDER — POTASSIUM CHLORIDE CRYS ER 20 MEQ PO TBCR
40.0000 meq | EXTENDED_RELEASE_TABLET | Freq: Once | ORAL | Status: AC
  Administered 2019-09-21: 40 meq via ORAL
  Filled 2019-09-21: qty 2

## 2019-09-21 MED ORDER — LACTATED RINGERS IV BOLUS: 1000.0000 mL | Freq: Once | INTRAVENOUS | Status: DC

## 2019-09-21 MED ORDER — MAGNESIUM SULFATE 2 GM/50ML IV SOLN: 2.0000 g | Freq: Once | INTRAVENOUS | Status: DC

## 2019-09-21 NOTE — ED Notes (Signed)
Pt presents with congestion of trach, RT to come to suction

## 2019-09-21 NOTE — H&P (Signed)
Jack Mcintyre [id number] DOB: 12-Aug-1956 DOA: 09/21/2019     PCP: Lindell Noe   Outpatient Specialists:    Pulmonary   Dr. Ander Slade, [place]     Patient arrived to ER on 09/21/19 at 1731  Patient coming from: home Lives  With family    Chief Complaint:   Chief Complaint  Patient presents with   Fatigue   Hypotension    HPI: Jack Mcintyre is a 63 y.o. male with medical history significant of throat cancer and radiation treatment status post trach and PEG this 2015, chronic cough,, history of tobacco and alcohol abuse, history of nocardia lung infection, CKD, anemia    Presented with progressive worsening is been having weakness altered mental status and aspiration recently admitted to [facility] and then continued to decline from there.  He has been having diarrhea for the past month some abdominal pain and distention.  Family notices been kind of shaky. He has been having some leg swelling but appears to be thinner.  He has a chronic cough still productive of sputum continues to drink alcohol smokes half a pack a day. Wife try to give him some medication in the morning when he had a lot of water come out of his tracheostomy wife is concerned he has been aspirating. He had a PEG  in place but has been taken out 5-6 months ago it slipped out and never got replaced  Last admission was September 28 to October 1 at [place] she was discharged yesterday and still declining   in May 2019 to [facility] for pneumonia   Family called in a reported that he is an alcoholic and has not been eating well he has been having lower extremity swelling for at least a month now He have had some black stools in the past but not recently  He drinks about a pint a day and have been for decades  He has has shortness of breath no chest pain    Infectious risk factors:  Reports shortness of breath  Cough,    In  ER RAPID COVID TEST NEGATIVE   Lab Results  Component Value Date   [place] NEGATIVE 09/21/2019     Regarding pertinent Chronic problems:   History of head and neck cancer status post PEG and tracheostomy  While in ER: Found to have hypokalemia down to 3.2 Blood glucose down to 66 Did not pass swallow evaluation emergency department noted to have significant desaturation Chest x-ray worrisome for pneumonia hemOccult negative Was ordered blood transfusion The following Work up has been ordered so far:  Orders Placed This Encounter  Procedures   SARS Coronavirus 2 [facility] order, Performed in [place] hospital lab) Nasopharyngeal Nasopharyngeal Swab   DG Chest Portable 1 View   CT Chest Wo Contrast   Comprehensive metabolic panel   CBC   Lactic acid, plasma   Magnesium   Brain natriuretic peptide   Occult blood card to lab, stool Provider will collect   Ethanol   Urinalysis, Routine w reflex microscopic   Ammonia   Lipase, blood   Diet NPO time specified   Cardiac monitoring   Saline Lock IV, Maintain IV access   Neuro checks q 2 hours x12 hours   Complete patient signature process for consent form   Practitioner attestation of consent   Stroke swallow screen   Clinical institute withdrawal assessment   Consult for Unassigned Medical Admission  ALL PATIENTS BEING ADMITTED/HAVING PROCEDURES NEED COVID-19 SCREENING  Airborne and Contact precautions   Pulse oximetry, continuous   SLP eval and treat Reason for evaluation: .Swallowing evaluation (BSE, MBS and/or diet order as indicated)   CBG monitoring, ED   POC occult blood, ED   EKG 12-Lead   ED EKG   Type and screen [place]   Prepare RBC   Aspiration precautions    Following Medications were ordered in ER: Medications  sodium chloride flush (NS) 0.9 % injection 3 mL (3 mLs Intravenous Not Given 09/21/19 1925)  0.9 %  sodium chloride infusion (has no administration in time range)  Ampicillin-Sulbactam (UNASYN) 3 g in  sodium chloride 0.9 % 100 mL IVPB (has no administration in time range)  potassium chloride SA (KLOR-CON) CR tablet 40 mEq (40 mEq Oral Given 09/21/19 2051)        Consult Orders  (From admission, onward)         Start     Ordered   09/21/19 2120  SLP eval and treat Reason for evaluation: .Swallowing evaluation (BSE, MBS and/or diet order as indicated)  Once    Question:  Reason for evaluation  Answer:  .Swallowing evaluation (BSE, MBS and/or diet order as indicated)   09/21/19 2120           Significant initial  Findings: Abnormal Labs Reviewed  COMPREHENSIVE METABOLIC PANEL - Abnormal; Notable for the following components:      Result Value   Potassium 3.2 (*)    Creatinine, Ser 1.67 (*)    Total Protein 6.4 (*)    Albumin 2.5 (*)    GFR calc non Af Amer 43 (*)    GFR calc Af Amer 50 (*)    All other components within normal limits  CBC - Abnormal; Notable for the following components:   WBC 2.7 (*)    RBC 2.08 (*)    Hemoglobin 7.2 (*)    HCT 21.5 (*)    MCV 103.4 (*)    MCH 34.6 (*)    RDW 18.7 (*)    Platelets 105 (*)    All other components within normal limits  BRAIN NATRIURETIC PEPTIDE - Abnormal; Notable for the following components:   B Natriuretic Peptide 185.4 (*)    All other components within normal limits  CBG MONITORING, ED - Abnormal; Notable for the following components:   Glucose-Capillary 66 (*)    All other components within normal limits  TROPONIN I (HIGH SENSITIVITY) - Abnormal; Notable for the following components:   Troponin I (High Sensitivity) 19 (*)    All other components within normal limits    Otherwise labs showing:    Recent Labs  Lab 09/21/19 1759 09/21/19 1922  NA 144  --   K 3.2*  --   CO2 27  --   GLUCOSE 74  --   BUN 13  --   CREATININE 1.67*  --   CALCIUM 8.9  --   MG  --  1.7    Cr    Up from baseline see below Lab Results  Component Value Date   CREATININE 1.67 (H) 09/21/2019   CREATININE 1.19 05/19/2018     CREATININE 1.43 (H) 05/18/2018    Recent Labs  Lab 09/21/19 1759  AST 39  ALT 13  ALKPHOS 66  BILITOT 0.8  PROT 6.4*  ALBUMIN 2.5*   Lab Results  Component Value Date   CALCIUM 8.9 09/21/2019   PHOS 3.5 05/18/2018     WBC  Component Value Date/Time   WBC 2.7 (L) 09/21/2019 1759   ANC    Component Value Date/Time   NEUTROABS 3.4 05/19/2018 0353   ALC No components found for: LYMPHAB    Plt: Lab Results  Component Value Date   PLT 105 (L) 09/21/2019    Lactic Acid, Venous    Component Value Date/Time   LATICACIDVEN 0.9 09/21/2019 2040       COVID-19 Labs    Lab Results  Component Value Date   SARSCOV2NAA NEGATIVE 09/21/2019       HG/HCT    Down     Component Value Date/Time   HGB 7.2 (L) 09/21/2019 1759   HCT 21.5 (L) 09/21/2019 1759   HCT 29.9 (L) 05/17/2018 0329    Recent Labs  Lab 09/21/19 2045  LIPASE 36   Recent Labs  Lab 09/21/19 2040  AMMONIA 17    No components found for: LABALBU   Troponin 19  Cardiac Panel (last 3 results) No results for input(s): CKTOTAL, CKMB, TROPONINI, RELINDX in the last 72 hours.     ECG: Ordered Personally reviewed by me showing: HR : 85 Rhythm:   NSR   no evidence of ischemic changes QTC 486   BNP (last 3 results) Recent Labs    09/21/19 1922  BNP 185.4*     CBG (last 3)  Recent Labs    09/21/19 1751  GLUCAP 66*     UA   ordered   Urine analysis: No results found for: COLORURINE, APPEARANCEUR, LABSPEC, PHURINE, GLUCOSEU, HGBUR, BILIRUBINUR, KETONESUR, PROTEINUR, UROBILINOGEN, NITRITE, LEUKOCYTESUR     Ordered  CXR - NON acute    CT  chest -extensive frothy debris vent dependent airways groundglass and nodular airspace disease left greater than right most likely secondary to extensive aspiration  , Right upper lobe spiculated nodule somewhat enlarged  ED Triage Vitals  Enc Vitals Group     BP 09/21/19 1741 (!) 79/53     Pulse Rate 09/21/19 1741 96     Resp  09/21/19 1741 17     Temp 09/21/19 1741 (!) 97.5 F (36.4 C)     Temp Source 09/21/19 1741 Oral     SpO2 09/21/19 1741 96 %     Weight 09/21/19 2007 150 lb (68 kg)     Height 09/21/19 2007 6\' 1"  (1.854 m)     Head Circumference --      Peak Flow --      Pain Score 09/21/19 1746 6     Pain Loc --      Pain Edu? --      Excl. in [place]? --   TMAX(24)@       Latest  Blood pressure 97/62, pulse 86, temperature (!) 97.5 F (36.4 C), temperature source Oral, resp. rate 15, height 6\' 1"  (1.854 m), weight 68 kg, SpO2 98 %.     Hospitalist was called for admission for recurrent aspiration pneumonia and anasarca   Review of Systems:    Pertinent positives include: fatigue,Bilateral lower extremity swelling    Constitutional:  No weight loss, night sweats, Fevers, chills,  weight loss  HEENT:  No headaches, Difficulty swallowing,Tooth/dental problems,Sore throat,  No sneezing, itching, ear ache, nasal congestion, post nasal drip,  Cardio-vascular:  No chest pain, Orthopnea, PND, anasarca, dizziness, palpitations.no GI:  No heartburn, indigestion, abdominal pain, nausea, vomiting, diarrhea, change in bowel habits, loss of appetite, melena, blood in stool, hematemesis Resp:  no shortness of breath at rest. No dyspnea  on exertion, No excess mucus, no productive cough, No non-productive cough, No coughing up of blood.No change in color of mucus.No wheezing. Skin:  no rash or lesions. No jaundice GU:  no dysuria, change in color of urine, no urgency or frequency. No straining to urinate.  No flank pain.  Musculoskeletal:  No joint pain or no joint swelling. No decreased range of motion. No back pain.  Psych:  No change in mood or affect. No depression or anxiety. No memory loss.  Neuro: no localizing neurological complaints, no tingling, no weakness, no double vision, no gait abnormality, no slurred speech, no confusion  All systems reviewed and apart from [place] all are negative  Past  Medical History:   Past Medical History:  Diagnosis Date   Cancer ([place])       No past surgical history on file.  Social History:  Ambulatory   independently       reports that he has been smoking cigarettes. He has a 10.00 pack-year smoking history. He does not have any smokeless tobacco history on file. He reports current alcohol use. No history on file for drug.   Family History:   Family History  Problem Relation Age of Onset   Hypertension Other     Allergies: No Known Allergies   Prior to Admission medications   Medication Sig Start Date End Date Taking? Authorizing Provider  cholecalciferol (VITAMIN D) 1000 units tablet Take 1,000 Units by mouth daily.    [provider]  folic acid (FOLVITE) 1 MG tablet Take 1 mg by mouth daily.    [provider]  Multiple Vitamin (MULTIVITAMIN) tablet Take 1 tablet by mouth daily.    [provider]  pseudoephedrine-guaifenesin (MUCINEX D) 60-600 MG 12 hr tablet Take 1 tablet by mouth every 12 (twelve) hours as needed for congestion.    [provider]   Physical Exam: Blood pressure 97/62, pulse 86, temperature (!) 97.5 F (36.4 C), temperature source Oral, resp. rate 15, height 6\' 1"  (1.854 m), weight 68 kg, SpO2 98 %. 1. General:  in No  Acute distress    Chronically ill -appearing 2. Psychological: Alert and  But not Oriented 3. Head/ENT:    Dry Mucous Membranes                          Head Non traumatic, neck supple                           Poor Dentition Trach in place  4. SKIN: normal    Skin clean Dry and intact no rash 5. Heart: Regular rate and rhythm no Murmur, no Rub or gallop 6. Lungs:  no wheezes or crackles   7. Abdomen: Soft, non-tender,  distended  bowel sounds present 8. Lower extremities: no clubbing, cyanosis, significant edema 9. Neurologically Grossly intact, moving all 4 extremities equally  10. MSK: Normal range of motion   All other LABS:     Recent  Labs  Lab 09/21/19 1759  WBC 2.7*  HGB 7.2*  HCT 21.5*  MCV 103.4*  PLT 105*     Recent Labs  Lab 09/21/19 1759 09/21/19 1922  NA 144  --   K 3.2*  --   CL 107  --   CO2 27  --   GLUCOSE 74  --   BUN 13  --   CREATININE 1.67*  --   CALCIUM  8.9  --   MG  --  1.7     Recent Labs  Lab 09/21/19 1759  AST 39  ALT 13  ALKPHOS 66  BILITOT 0.8  PROT 6.4*  ALBUMIN 2.5*       Cultures:    Component Value Date/Time   SDES SPU 05/16/2018 2220   SDES  05/16/2018 2220    SPU Performed at [facility], [place] [address] [place], [place] [address]    [place] 05/16/2018 2220   SPECREQUEST  05/16/2018 2220    NONE Reflexed from [id number] Performed at [facility], [place] [address], [place], [place] [address]    CULT FEW PSEUDOMONAS AERUGINOSA 05/16/2018 2220   REPTSTATUS 05/17/2018 FINAL 05/16/2018 2220   REPTSTATUS 05/19/2018 FINAL 05/16/2018 2220     Radiological Exams on Admission: Ct Chest Wo Contrast  Result Date: 09/21/2019 CLINICAL DATA:  Weakness, trouble swallowing, aspirating EXAM: CT CHEST WITHOUT CONTRAST TECHNIQUE: Multidetector CT imaging of the chest was performed following the standard protocol without IV contrast. COMPARISON:  09/07/2018 FINDINGS: Cardiovascular: Aortic atherosclerosis. Normal heart size. Three-vessel coronary artery calcifications. No pericardial effusion. Mediastinum/Nodes: No enlarged mediastinal, hilar, or axillary lymph nodes. Thyroid gland, trachea, and esophagus demonstrate no significant findings. Lungs/Pleura: Moderate centrilobular emphysema. Tracheostomy. Small bilateral pleural effusions. There is extensive frothy debris within the dependent airways of the lungs bilaterally with ground-glass and clustered nodular airspace disease in the lung bases, left greater than right. No significant change in cavitary lesions of the dependent bilateral upper lungs (series 4, image 37, 52). Interval  enlargement of a somewhat spiculated appearing nodule in the 8 focal right upper lobe, measuring 1.7 x 1.3 cm, previously 1.3 x 0.6 cm (series 4, image 42). Evidence of prior left upper lobe wedge resection. Unchanged, somewhat spiculated appearing consolidation architectural distortion of the perihilar left upper lobe (series 4, image 75). Upper Abdomen: Moderate volume ascites in the partially included upper abdomen. Musculoskeletal: No chest wall mass or suspicious bone lesions identified. IMPRESSION: 1. There is extensive frothy debris within the dependent airways of the lungs bilaterally with ground-glass and clustered nodular airspace disease in the lung bases, left greater than right. Findings are consistent with extensive aspiration, which may be ongoing. 2.  Small bilateral pleural effusions. 3. Interval enlargement of a somewhat spiculated appearing nodule in the 8 focal right upper lobe, measuring 1.7 x 1.3 cm, previously 1.3 x 0.6 cm (series 4, image 42). This finding is concerning for malignancy. Consider characterization for metabolic activity by PET-CT or alternately tissue sampling. 4. No significant change in cavitary lesions of the dependent bilateral upper lungs (series 4, image 37, 52), likely sequelae of prior infection. 5.  Tracheostomy. 6.  Emphysema (ICD10-J43.9). 7.  Coronary artery disease.  Aortic Atherosclerosis (ICD10-I70.0). 8.  Ascites in the included upper abdomen. Electronically Signed   By: Eddie Candle M.D.   On: 09/21/2019 21:55   Dg Chest Portable 1 View  Result Date: 09/21/2019 CLINICAL DATA:  Weakness and altered mental status. Shortness of breath. EXAM: PORTABLE CHEST 1 VIEW COMPARISON:  CT chest 09/07/2018 FINDINGS: Mild improvement in the right mid lung and upper lung airspace opacities compared to the chest CT of 09/07/2018. Mildly improved left upper lobe airspace opacity. Left rib deformities noted. Tracheostomy tube noted. Mildly increased hazy interstitial opacity  in the lung bases, left greater than right. Heart size within normal limits. IMPRESSION: 1. Improvement in the previous airspace opacities in the upper lobes, although these have not  resolved. Prior CT referenced a 1.3 cm solid pulmonary nodule which will need follow as detailed in the prior CT report. 2. Accentuated interstitial accentuation in the lung bases, cause uncertain. This could be from noncardiogenic edema or superimposed atypical pneumonia. Electronically Signed   By: Van Clines M.D.   On: 09/21/2019 19:39    Chart has been reviewed   Assessment/Plan  63 y.o. male with medical history significant of throat cancer and radiation treatment status post trach and PEG this 2015, chronic cough,, history of tobacco and alcohol abuse, history of nocardia lung infection, CKD, anemia  Admitted for aspiration pneumonia  Present on Admission:  Aspiration pneumonia ([place]) -keep n.p.o. patient in the past was status post PEG tube it is got dislodged and never replaced.  Will reorder speech pathology evaluation Cover with Zosyn for now obtain sputum cultures.   Acute kidney injury ([place]) -rehydrate and follow obtain urine electrolytes   Malnutrition of moderate degree -check prealbumin order nutritional consult   Alcohol abuse -high risk for withdrawal will order CIWA protocol admit to stepdown spoke about importance of quitting patient at this point interested in quitting. Would benefit from further evaluation for cirrhosis   Hypokalemia -will replace   Hypoglycemia -most likely secondary to poor p.o. intake administer thiamine and treat as needed if unable to tolerate p.o. may need to initiate D5   Anemia -Hemoccult negative unclear etiology, anemia panel not ordered prior to transfusion will attempt to add on, History of melena in the past currently Hemoccult negative, will probably need to have follow-up with GI as an outpatient   Lung nodule patient will need to continue to  follow-up with pulmonology as an outpatient  Anasarca is likely secondary to hypoalbuminemia in the setting of poor p.o. intake/possible liver disease.  Patient probably with underlying ascites obtain ultrasound-guided thoracentesis. Given significant hypotension will give infuse albumin  Hypotension -patient maintaining mental status, in the setting of underlying liver disease suspected but still somewhat lower than his baseline of around [id number] systolic. We will see if it improves with blood transfusion, albumin infusion and IV fluids as needed   Tobacco abuse spoke about importance of quitting Other plan as per orders.  DVT prophylaxis:  SCD      Code Status:  FULL CODE as per patient   I had personally discussed CODE STATUS with patient and family   Family Communication:   Family  at  Bedside  plan of care was discussed  With mother  Disposition Plan:     likely will need placement for rehabilitation                                               Would benefit from PT/OT eval prior to DC  Ordered                   Swallow eval - SLP ordered                   Social Work  consulted                   Nutrition    consulted  Consults called: none    Admission status:  ED Disposition    ED Disposition Condition Comment   Admit  The patient appears reasonably stabilized for admission considering the current resources, flow, and capabilities available in the ED at this time, and I doubt any other [facility] requiring further screening and/or treatment in the ED prior to admission is  present.         inpatient     Expect 2 midnight stay secondary to severity of patient's current illness including   hemodynamic instability despite optimal treatment (tachycardia hypotension )  Severe lab/radiological/exam abnormalities including:  anemia   and extensive comorbidities including:  substance abuse     malignancy,   That are currently affecting  medical management.   I expect  patient to be hospitalized for 2 midnights requiring inpatient medical care.  Patient is at high risk for adverse outcome (such as loss of life or disability) if not treated.  Indication for inpatient stay as follows:   change from baseline regarding mental status Hemodynamic instability despite maximal medical therapy,    inability to maintain oral hydration    Need for operative/procedural  intervention   Need for IV antibiotics, IV fluids    Level of care         SDU tele indefinitely please discontinue once patient no longer qualifies  Precautions:  NONE  No active isolations  PPE: Used by the provider:   P100  eye Goggles,  Gloves      Sahiba Granholm 09/22/2019, 1:20 AM    [facility]     after 2 AM please page floor coverage PA If 7AM-7PM, please contact the day team taking care of the patient using [email or web address]

## 2019-09-21 NOTE — ED Provider Notes (Signed)
[facility] EMERGENCY DEPARTMENT Provider Note   CSN: [id number] Arrival date & time: 09/21/19  1731     History   Chief Complaint Chief Complaint  Patient presents with   Fatigue   Hypotension    HPI Jack Mcintyre is a 63 y.o. male.     HPI  The patient is a 63 year old male with a history of EtOH abuse, throat cancer status post tracheostomy and PEG tube placement in 2015 who presents to the ED from the [place] with concern for weakness, confusion, trouble swallowing, problems with aspiration, and hypotension.   He was hospitalized at the [place] from Monday until discharge yesterday. He reports worsening confusion and slight chills since discharge. Hasn't been eating much for the past month and his wife thinks he has lost weight and is retaining fluid. He has had worsening swelling in his legs bilaterally. He had been on Lasix but it was stopped due to hypotension. He has had worsening stomach distention for the past three weeks. He has a chronic cough of productive sputum. He continues to drink EtOH and smoke 1/2 ppd. Continues to endorse worsening fatigue and mild SOB. His wife was trying to administer medication this AM and a large amount of water was coming out of his tracheostomy. No chest pain. No abdominal pain. No recent nausea, vomiting, or diarrhea. He has a history of black stools in the past but not recently.  His wife feels that he was worse at discharge from the [place] compared to when he arrived.   Past Medical History:  Diagnosis Date   Cancer [facility])     Patient Active Problem List   Diagnosis Date Noted   Malnutrition of moderate degree 05/17/2018   Pneumonia 05/16/2018   Acute kidney injury ([place]) 05/16/2018   Hyponatremia 05/16/2018    No past surgical history on file.      Home Medications    Prior to Admission medications   Medication Sig Start Date End Date Taking? Authorizing Provider  cholecalciferol (VITAMIN D) 1000 units tablet  Take 1,000 Units by mouth daily.    [provider]  folic acid (FOLVITE) 1 MG tablet Take 1 mg by mouth daily.    [provider]  Multiple Vitamin (MULTIVITAMIN) tablet Take 1 tablet by mouth daily.    [provider]  pseudoephedrine-guaifenesin (MUCINEX D) 60-600 MG 12 hr tablet Take 1 tablet by mouth every 12 (twelve) hours as needed for congestion.    [provider]    Family History No family history on file.  Social History Social History   Tobacco Use   Smoking status: Current Every Day Smoker    Packs/day: 0.50    Years: 20.00    Pack years: 10.00    Types: Cigarettes   Tobacco comment: uses vape when out of cigarettes  Substance Use Topics   Alcohol use: Yes    Comment: States he drinks occasionally   Drug use: Not on file     Allergies   Patient has no known allergies.   Review of Systems Review of Systems  Constitutional: Positive for appetite change, fatigue and unexpected weight change. Negative for chills and fever.  HENT: Negative for sore throat.   Eyes: Negative for visual disturbance.  Respiratory: Positive for cough and shortness of breath.   Cardiovascular: Negative for chest pain and palpitations.  Gastrointestinal: Negative for abdominal pain, constipation, nausea and vomiting.  Genitourinary: Negative for dysuria and hematuria.  Musculoskeletal: Negative  for back pain.  Skin: Negative for rash.  Neurological: Positive for weakness and light-headedness. Negative for seizures and syncope.  Psychiatric/Behavioral: Positive for confusion.  All other systems reviewed and are negative.    Physical Exam Updated Vital Signs BP (!) 84/63    Pulse 77    Temp (!) 97.5 F (36.4 C) (Oral)    Resp 13    Ht 6\' 1"  (1.854 m)    Wt 68 kg    SpO2 93%    BMI 19.79 kg/m   Physical Exam Vitals signs and nursing note reviewed.  Constitutional:      Appearance: He is well-developed.  HENT:     Head: Normocephalic and  atraumatic.  Eyes:     Conjunctiva/sclera: Conjunctivae normal.  Neck:     Musculoskeletal: Neck supple.     Comments: Tracheostomy in place without surrounding erythema or drainage Cardiovascular:     Rate and Rhythm: Normal rate and regular rhythm.     Heart sounds: No murmur.  Pulmonary:     Effort: Pulmonary effort is normal. No tachypnea or respiratory distress.     Breath sounds: Rhonchi present.  Abdominal:     General: There is distension.     Palpations: Abdomen is soft.     Tenderness: There is abdominal tenderness in the epigastric area.     Comments: PEG site without erythema. Abdominal distention with mild fluid wave consistent with ascites. No diffuse abdominal TTP.  Genitourinary:    Rectum: Normal. Guaiac result negative.  Skin:    General: Skin is warm and dry.  Neurological:     Mental Status: He is alert.      ED Treatments / Results  Labs (all labs ordered are listed, but only abnormal results are displayed) Labs Reviewed  COMPREHENSIVE METABOLIC PANEL - Abnormal; Notable for the following components:      Result Value   Potassium 3.2 (*)    Creatinine, Ser 1.67 (*)    Total Protein 6.4 (*)    Albumin 2.5 (*)    GFR calc non Af Amer 43 (*)    GFR calc Af Amer 50 (*)    All other components within normal limits  CBC - Abnormal; Notable for the following components:   WBC 2.7 (*)    RBC 2.08 (*)    Hemoglobin 7.2 (*)    HCT 21.5 (*)    MCV 103.4 (*)    MCH 34.6 (*)    RDW 18.7 (*)    Platelets 105 (*)    All other components within normal limits  BRAIN NATRIURETIC PEPTIDE - Abnormal; Notable for the following components:   B Natriuretic Peptide 185.4 (*)    All other components within normal limits  CBG MONITORING, ED - Abnormal; Notable for the following components:   Glucose-Capillary 66 (*)    All other components within normal limits  TROPONIN I (HIGH SENSITIVITY) - Abnormal; Notable for the following components:   Troponin I (High  Sensitivity) 19 (*)    All other components within normal limits  SARS CORONAVIRUS 2 (HOSPITAL ORDER, [facility])  LACTIC ACID, PLASMA  LACTIC ACID, PLASMA  MAGNESIUM  ETHANOL  AMMONIA  LIPASE, BLOOD  OCCULT BLOOD X 1 CARD TO LAB, STOOL  URINALYSIS, ROUTINE W REFLEX MICROSCOPIC  PROTIME-INR  POC OCCULT BLOOD, ED  TYPE AND SCREEN  PREPARE RBC (CROSSMATCH)  TROPONIN I (HIGH SENSITIVITY)    EKG EKG Interpretation  Date/Time:  Friday September 21 2019 18:17:48 EDT Ventricular Rate:  85 PR Interval:    QRS Duration: 110 QT Interval:  408 QTC Calculation: 486 R Axis:   72 Text Interpretation:  Sinus rhythm Atrial premature complexes in couplets Borderline prolonged QT interval Confirmed by Lacretia Leigh ([id number]) on 09/21/2019 7:03:57 PM   Radiology Ct Chest Wo Contrast  Result Date: 09/21/2019 CLINICAL DATA:  Weakness, trouble swallowing, aspirating EXAM: CT CHEST WITHOUT CONTRAST TECHNIQUE: Multidetector CT imaging of the chest was performed following the standard protocol without IV contrast. COMPARISON:  09/07/2018 FINDINGS: Cardiovascular: Aortic atherosclerosis. Normal heart size. Three-vessel coronary artery calcifications. No pericardial effusion. Mediastinum/Nodes: No enlarged mediastinal, hilar, or axillary lymph nodes. Thyroid gland, trachea, and esophagus demonstrate no significant findings. Lungs/Pleura: Moderate centrilobular emphysema. Tracheostomy. Small bilateral pleural effusions. There is extensive frothy debris within the dependent airways of the lungs bilaterally with ground-glass and clustered nodular airspace disease in the lung bases, left greater than right. No significant change in cavitary lesions of the dependent bilateral upper lungs (series 4, image 37, 52). Interval enlargement of a somewhat spiculated appearing nodule in the 8 focal right upper lobe, measuring 1.7 x 1.3 cm, previously 1.3 x 0.6 cm (series 4, image 42). Evidence of  prior left upper lobe wedge resection. Unchanged, somewhat spiculated appearing consolidation architectural distortion of the perihilar left upper lobe (series 4, image 75). Upper Abdomen: Moderate volume ascites in the partially included upper abdomen. Musculoskeletal: No chest wall mass or suspicious bone lesions identified. IMPRESSION: 1. There is extensive frothy debris within the dependent airways of the lungs bilaterally with ground-glass and clustered nodular airspace disease in the lung bases, left greater than right. Findings are consistent with extensive aspiration, which may be ongoing. 2.  Small bilateral pleural effusions. 3. Interval enlargement of a somewhat spiculated appearing nodule in the 8 focal right upper lobe, measuring 1.7 x 1.3 cm, previously 1.3 x 0.6 cm (series 4, image 42). This finding is concerning for malignancy. Consider characterization for metabolic activity by PET-CT or alternately tissue sampling. 4. No significant change in cavitary lesions of the dependent bilateral upper lungs (series 4, image 37, 52), likely sequelae of prior infection. 5.  Tracheostomy. 6.  Emphysema (ICD10-J43.9). 7.  Coronary artery disease.  Aortic Atherosclerosis (ICD10-I70.0). 8.  Ascites in the included upper abdomen. Electronically Signed   By: Eddie Candle M.D.   On: 09/21/2019 21:55   Dg Chest Portable 1 View  Result Date: 09/21/2019 CLINICAL DATA:  Weakness and altered mental status. Shortness of breath. EXAM: PORTABLE CHEST 1 VIEW COMPARISON:  CT chest 09/07/2018 FINDINGS: Mild improvement in the right mid lung and upper lung airspace opacities compared to the chest CT of 09/07/2018. Mildly improved left upper lobe airspace opacity. Left rib deformities noted. Tracheostomy tube noted. Mildly increased hazy interstitial opacity in the lung bases, left greater than right. Heart size within normal limits. IMPRESSION: 1. Improvement in the previous airspace opacities in the upper lobes, although  these have not resolved. Prior CT referenced a 1.3 cm solid pulmonary nodule which will need follow as detailed in the prior CT report. 2. Accentuated interstitial accentuation in the lung bases, cause uncertain. This could be from noncardiogenic edema or superimposed atypical pneumonia. Electronically Signed   By: Van Clines M.D.   On: 09/21/2019 19:39    Procedures Procedures (including critical care time)  Medications Ordered in ED Medications  sodium chloride flush (NS) 0.9 % injection 3 mL (3 mLs Intravenous Not Given 09/21/19 1925)  0.9 %  sodium chloride infusion (has no administration in time range)  Ampicillin-Sulbactam (UNASYN) 3 g in sodium chloride 0.9 % 100 mL IVPB (has no administration in time range)  potassium chloride SA (KLOR-CON) CR tablet 40 mEq (40 mEq Oral Given 09/21/19 2051)     Initial Impression / Assessment and Plan / ED Course  I have reviewed the triage vital signs and the nursing notes.  Pertinent labs & imaging results that were available during my care of the patient were reviewed by me and considered in my medical decision making (see chart for details).  Clinical Course as of Sep 21 2227  Fri Sep 21, 2019  1807 Glucose-Capillary(!): 66 [JL]  1807 BP(!): 79/53 [JL]  1913 BP: 99/83 [JL]  1914 Pulse Rate: 89 [JL]  1930 Potassium(!): 3.2 [JL]  1930 Hemoglobin(!): 7.2 [JL]    Clinical Course User Index [JL] Regan Lemming, MD       The patient has a history of throat cancer status post tracheostomy in 2015, EtOH abuse and tobacco abuse with chronic daily use, who presents to the ED with concern for function decline, weakness, confusion, trouble swallowing, problems with aspiration, and hypotension. The patient reports a chronic cough and his wife is concerned for aspiration. He recently was hospitalized at the [place] from 9/28-10/1 and continues to experience functional decline.   On arrival, the patient was afebrile, soft blood pressures, improved  subsequently without IVFs, not tachycardic, not tachypenic, satting 94% on room air. Lungs with bilateral rhonchi on exam. Abdomen distended with a fluid wave. Bilateral lower extremity edema present. No significant TTP of the abdomen with low concern for SBP at this time. Initial workup significant for a Hgb of 7.2. The patient denies recent melena. Negative FOBT and no gross melena on exam.   EKG with sinus rhythm with borderline prolonged QTc. CXR with "accentuated interstitial accentuation in the lung bases, cause uncertain. This could be from noncardiogenic edema or superimposed atypical pneumonia." The patient was placed on Unasyn to cover for aspiration PNA. A CT Chest WO was ordered to further characterize. Results indicate sequelae of bilateral extensive aspiration in the lungs.   Labs: Troponins x2 17->19, Ammonia 17, Lactic acid 0.9, EtOH normal, CBC with pancytopenia, WBC 2.7, Hgb 7.2, Plts 105. CMP with normal LFTS and bili, low K at 3.2, elevated Cr 1.67 (baseline appears to be between 1.19-1.43)  The patient failed a bedside nursing swallow evaluation, with aspiration of water and desaturation noted on the monitor. He was placed NPO.   Internal medicine consulted for admission.   Final Clinical Impressions(s) / ED Diagnoses   Final diagnoses:  Dehydration  Weakness  Anemia, unspecified type  Aspiration pneumonia of both lungs, unspecified aspiration pneumonia type, unspecified part of lung [facility])    ED Discharge Orders    None       Regan Lemming, MD 09/21/19 [id number]    Lacretia Leigh, MD 09/24/19 1701

## 2019-09-21 NOTE — ED Notes (Signed)
Pt eats and drinks at home.  Pt swallowing water and RN at bedside. Noted cough and decrease in SpO2 when swallowing. Dr. Armandina Gemma informed. SLP eval placed and pt. NPO

## 2019-09-21 NOTE — ED Notes (Signed)
Electronic blood consent obtained.  Mother at bedside

## 2019-09-21 NOTE — ED Triage Notes (Signed)
Pt here with family from the [place] where he was seen for weakness, ams, and hypotension, trouble swallowing, aspirating per family, all progressively worsening. Family sts he is worse after leaving than when he got to the [place]. Pt had diarrhea for one month about a month ago,ednorses abdominal pain and distended stomach and family sts he has been more shaky than normal.

## 2019-09-21 NOTE — ED Notes (Signed)
RN reached out to Blood Bank concerning delay on release of blood. Per blood bank, RN to send for blood.

## 2019-09-21 NOTE — ED Notes (Signed)
ED TO INPATIENT HANDOFF REPORT  ED Nurse Name and Phone #:  [phone]  S Name/Age/Gender Jack Mcintyre 63 y.o. male Room/Bed: 018C/018C  Code Status   Code Status: Prior  Home/SNF/Other Home Patient oriented to: self (disorientation to DOB), place, time and situation (intermittent disorientation to place)  Is this baseline? No   Triage Complete: Triage complete  Chief Complaint decreased LOC, Increased sputal, Swelling in both feet, diarrhea  Triage Note Pt here with family from the [place] where he was seen for weakness, ams, and hypotension, trouble swallowing, aspirating per family, all progressively worsening. Family sts he is worse after leaving than when he got to the [place]. Pt had diarrhea for one month about a month ago,ednorses abdominal pain and distended stomach and family sts he has been more shaky than normal.     Allergies No Known Allergies  Level of Care/Admitting Diagnosis ED Disposition    ED Disposition Condition [place]: [place] [[id number]]  Level of Care: Progressive [102]  Covid Evaluation: Confirmed COVID Negative  Diagnosis: Aspiration pneumonia [facility]) [id number]  Admitting Physician: Toy Baker [[id number]]  Attending Physician: Toy Baker [[id number]]  Estimated length of stay: 3 - 4 days  Certification:: I certify this patient will need inpatient services for at least 2 midnights  PT Class (Do Not Modify): Inpatient [101]  PT Acc Code (Do Not Modify): Private [1]       B Medical/Surgery History Past Medical History:  Diagnosis Date  . Cancer ([place])    No past surgical history on file.   A IV Location/Drains/Wounds Patient Lines/Drains/Airways Status   Active Line/Drains/Airways    Name:   Placement date:   Placement time:   Site:   Days:   Peripheral IV 09/21/19 Left Antecubital   09/21/19    1843    Antecubital   less than 1   Peripheral IV 09/21/19 Right Forearm   09/21/19    2351     Forearm   less than 1   Tracheostomy Shiley 6 mm Uncuffed   05/16/18    1900    6 mm   493          Intake/Output Last 24 hours No intake or output data in the 24 hours ending 09/21/19 2351  Labs/Imaging Results for orders placed or performed during the hospital encounter of 09/21/19 (from the past 48 hour(s))  CBG monitoring, ED     Status: Abnormal   Collection Time: 09/21/19  5:51 PM  Result Value Ref Range   Glucose-Capillary 66 (L) 70 - 99 mg/dL   Comment 1 Notify RN    Comment 2 Document in Chart   Comprehensive metabolic panel     Status: Abnormal   Collection Time: 09/21/19  5:59 PM  Result Value Ref Range   Sodium 144 135 - 145 mmol/L   Potassium 3.2 (L) 3.5 - 5.1 mmol/L   Chloride 107 98 - 111 mmol/L   CO2 27 22 - 32 mmol/L   Glucose, Bld 74 70 - 99 mg/dL   BUN 13 8 - 23 mg/dL   Creatinine, Ser 1.67 (H) 0.61 - 1.24 mg/dL   Calcium 8.9 8.9 - 10.3 mg/dL   Total Protein 6.4 (L) 6.5 - 8.1 g/dL   Albumin 2.5 (L) 3.5 - 5.0 g/dL   AST 39 15 - 41 U/L   ALT 13 0 - 44 U/L   Alkaline Phosphatase 66 38 - 126 U/L   Total  Bilirubin 0.8 0.3 - 1.2 mg/dL   GFR calc non Af Amer 43 (L) >60 mL/min   GFR calc Af Amer 50 (L) >60 mL/min   Anion gap 10 5 - 15    Comment: Performed at [facility] [address], [place], [place] [address]  CBC     Status: Abnormal   Collection Time: 09/21/19  5:59 PM  Result Value Ref Range   WBC 2.7 (L) 4.0 - 10.5 K/uL   RBC 2.08 (L) 4.22 - 5.81 MIL/uL   Hemoglobin 7.2 (L) 13.0 - 17.0 g/dL   HCT 21.5 (L) 39.0 - 52.0 %   MCV 103.4 (H) 80.0 - 100.0 fL   MCH 34.6 (H) 26.0 - 34.0 pg   MCHC 33.5 30.0 - 36.0 g/dL   RDW 18.7 (H) 11.5 - 15.5 %   Platelets 105 (L) 150 - 400 K/uL    Comment: REPEATED TO VERIFY PLATELET COUNT CONFIRMED BY SMEAR Immature Platelet Fraction may be clinically indicated, consider ordering this additional test [id number]    nRBC 0.0 0.0 - 0.2 %    Comment: Performed at [facility], [place] [address],  [place], [place] [address]  Troponin I (High Sensitivity)     Status: None   Collection Time: 09/21/19  7:22 PM  Result Value Ref Range   Troponin I (High Sensitivity) 17 <18 ng/L    Comment: (NOTE) Elevated high sensitivity troponin I (hsTnI) values and significant  changes across serial measurements may suggest ACS but many other  chronic and acute conditions are known to elevate hsTnI results.  Refer to the "Links" section for chest pain algorithms and additional  guidance. Performed at [facility], [place] [address] [place], [place] [address]   Lactic acid, plasma     Status: None   Collection Time: 09/21/19  7:22 PM  Result Value Ref Range   Lactic Acid, Venous 1.1 0.5 - 1.9 mmol/L    Comment: Performed at [facility] [address], [place], [place] [address]  Magnesium     Status: None   Collection Time: 09/21/19  7:22 PM  Result Value Ref Range   Magnesium 1.7 1.7 - 2.4 mg/dL    Comment: Performed at [facility], [place] [address] [place], [place] [address]  Brain natriuretic peptide     Status: Abnormal   Collection Time: 09/21/19  7:22 PM  Result Value Ref Range   B Natriuretic Peptide 185.4 (H) 0.0 - 100.0 pg/mL    Comment: Performed at [facility] [address], [place], [place] [address]  Type and screen [place]     Status: None (Preliminary result)   Collection Time: 09/21/19  7:22 PM  Result Value Ref Range   ABO/RH(D) B NEG    Antibody Screen NEG    Sample Expiration 09/24/2019,2359    Unit Number [id number]    Blood Component Type RBC LR PHER2    Unit division 00    Status of Unit ALLOCATED    Transfusion Status OK TO TRANSFUSE    Crossmatch Result Compatible    Unit Number [id number]    Blood Component Type RED CELLS,LR    Unit division 00    Status of Unit ISSUED    Transfusion Status OK TO TRANSFUSE    Crossmatch Result      Compatible Performed at [facility], [place] [address], [place],  [place] [address]   Prepare  RBC     Status: None   Collection Time: 09/21/19  7:22 PM  Result Value Ref Range   Order Confirmation      ORDER PROCESSED BY BLOOD BANK Performed at [facility], [place] [address] [place], [place] [address]   Ethanol     Status: None   Collection Time: 09/21/19  7:23 PM  Result Value Ref Range   Alcohol, Ethyl (B) <10 <10 mg/dL    Comment: (NOTE) Lowest detectable limit for serum alcohol is 10 mg/dL. For medical purposes only. Performed at [facility], [place] [address] [place], [place] [address]   SARS Coronavirus 2 [facility] order, Performed in [facility] hospital lab) Nasopharyngeal Nasopharyngeal Swab     Status: None   Collection Time: 09/21/19  8:10 PM   Specimen: Nasopharyngeal Swab  Result Value Ref Range   SARS Coronavirus 2 NEGATIVE NEGATIVE    Comment: (NOTE) If result is NEGATIVE SARS-CoV-2 target nucleic acids are NOT DETECTED. The SARS-CoV-2 RNA is generally detectable in upper and lower  respiratory specimens during the acute phase of infection. The lowest  concentration of SARS-CoV-2 viral copies this assay can detect is 250  copies / mL. A negative result does not preclude SARS-CoV-2 infection  and should not be used as the sole basis for treatment or other  patient management decisions.  A negative result may occur with  improper specimen collection / handling, submission of specimen other  than nasopharyngeal swab, presence of viral mutation(s) within the  areas targeted by this assay, and inadequate number of viral copies  (<250 copies / mL). A negative result must be combined with clinical  observations, patient history, and epidemiological information. If result is POSITIVE SARS-CoV-2 target nucleic acids are DETECTED. The SARS-CoV-2 RNA is generally detectable in upper and lower  respiratory specimens dur ing the acute phase of infection.  Positive  results are indicative of active infection with SARS-CoV-2.  Clinical   correlation with patient history and other diagnostic information is  necessary to determine patient infection status.  Positive results do  not rule out bacterial infection or co-infection with other viruses. If result is PRESUMPTIVE POSTIVE SARS-CoV-2 nucleic acids MAY BE PRESENT.   A presumptive positive result was obtained on the submitted specimen  and confirmed on repeat testing.  While 2019 novel coronavirus  (SARS-CoV-2) nucleic acids may be present in the submitted sample  additional confirmatory testing may be necessary for epidemiological  and / or clinical management purposes  to differentiate between  SARS-CoV-2 and other Sarbecovirus currently known to infect humans.  If clinically indicated additional testing with an alternate test  methodology [phone]) is advised. The SARS-CoV-2 RNA is generally  detectable in upper and lower respiratory sp ecimens during the acute  phase of infection. The expected result is Negative. Fact Sheet for Patients:  StrictlyIdeas.no Fact Sheet for Healthcare Providers: BankingDealers.co.za This test is not yet approved or cleared by the [place] FDA and has been authorized for detection and/or diagnosis of SARS-CoV-2 by FDA under an Emergency Use Authorization (EUA).  This EUA will remain in effect (meaning this test can be used) for the duration of the COVID-19 declaration under Section 564(b)(1) of the Act, 21 U.S.C. section 360bbb-3(b)(1), unless the authorization is terminated or revoked sooner. Performed at [facility], [place] [address] [place], [place] [address]   Lactic acid, plasma     Status: None   Collection Time: 09/21/19  8:40 PM  Result  Value Ref Range   Lactic Acid, Venous 0.9 0.5 - 1.9 mmol/L    Comment: Performed at [facility] [address] [place], [place] [address]  Ammonia     Status: None   Collection Time: 09/21/19  8:40 PM  Result Value Ref  Range   Ammonia 17 9 - 35 umol/L    Comment: Performed at [facility], [place] [address], [place], [place] [address]  Troponin I (High Sensitivity)     Status: Abnormal   Collection Time: 09/21/19  8:45 PM  Result Value Ref Range   Troponin I (High Sensitivity) 19 (H) <18 ng/L    Comment: (NOTE) Elevated high sensitivity troponin I (hsTnI) values and significant  changes across serial measurements may suggest ACS but many other  chronic and acute conditions are known to elevate hsTnI results.  Refer to the "Links" section for chest pain algorithms and additional  guidance. Performed at [facility], [place] [address], [place], [place] [address]   Lipase, blood     Status: None   Collection Time: 09/21/19  8:45 PM  Result Value Ref Range   Lipase 36 11 - 51 U/L    Comment: Performed at [facility] [address], [place], [place] [address]  POC occult blood, ED     Status: None   Collection Time: 09/21/19  8:58 PM  Result Value Ref Range   Fecal Occult Bld NEGATIVE NEGATIVE   Ct Chest Wo Contrast  Result Date: 09/21/2019 CLINICAL DATA:  Weakness, trouble swallowing, aspirating EXAM: CT CHEST WITHOUT CONTRAST TECHNIQUE: Multidetector CT imaging of the chest was performed following the standard protocol without IV contrast. COMPARISON:  09/07/2018 FINDINGS: Cardiovascular: Aortic atherosclerosis. Normal heart size. Three-vessel coronary artery calcifications. No pericardial effusion. Mediastinum/Nodes: No enlarged mediastinal, hilar, or axillary lymph nodes. Thyroid gland, trachea, and esophagus demonstrate no significant findings. Lungs/Pleura: Moderate centrilobular emphysema. Tracheostomy. Small bilateral pleural effusions. There is extensive frothy debris within the dependent airways of the lungs bilaterally with ground-glass and clustered nodular airspace disease in the lung bases, left greater than right. No significant change in cavitary lesions of the dependent  bilateral upper lungs (series 4, image 37, 52). Interval enlargement of a somewhat spiculated appearing nodule in the 8 focal right upper lobe, measuring 1.7 x 1.3 cm, previously 1.3 x 0.6 cm (series 4, image 42). Evidence of prior left upper lobe wedge resection. Unchanged, somewhat spiculated appearing consolidation architectural distortion of the perihilar left upper lobe (series 4, image 75). Upper Abdomen: Moderate volume ascites in the partially included upper abdomen. Musculoskeletal: No chest wall mass or suspicious bone lesions identified. IMPRESSION: 1. There is extensive frothy debris within the dependent airways of the lungs bilaterally with ground-glass and clustered nodular airspace disease in the lung bases, left greater than right. Findings are consistent with extensive aspiration, which may be ongoing. 2.  Small bilateral pleural effusions. 3. Interval enlargement of a somewhat spiculated appearing nodule in the 8 focal right upper lobe, measuring 1.7 x 1.3 cm, previously 1.3 x 0.6 cm (series 4, image 42). This finding is concerning for malignancy. Consider characterization for metabolic activity by PET-CT or alternately tissue sampling. 4. No significant change in cavitary lesions of the dependent bilateral upper lungs (series 4, image 37, 52), likely sequelae of prior infection. 5.  Tracheostomy. 6.  Emphysema (ICD10-J43.9). 7.  Coronary artery disease.  Aortic Atherosclerosis (ICD10-I70.0). 8.  Ascites in the included upper abdomen. Electronically Signed  By: Eddie Candle M.D.   On: 09/21/2019 21:55   Dg Chest Portable 1 View  Result Date: 09/21/2019 CLINICAL DATA:  Weakness and altered mental status. Shortness of breath. EXAM: PORTABLE CHEST 1 VIEW COMPARISON:  CT chest 09/07/2018 FINDINGS: Mild improvement in the right mid lung and upper lung airspace opacities compared to the chest CT of 09/07/2018. Mildly improved left upper lobe airspace opacity. Left rib deformities noted. Tracheostomy  tube noted. Mildly increased hazy interstitial opacity in the lung bases, left greater than right. Heart size within normal limits. IMPRESSION: 1. Improvement in the previous airspace opacities in the upper lobes, although these have not resolved. Prior CT referenced a 1.3 cm solid pulmonary nodule which will need follow as detailed in the prior CT report. 2. Accentuated interstitial accentuation in the lung bases, cause uncertain. This could be from noncardiogenic edema or superimposed atypical pneumonia. Electronically Signed   By: Van Clines M.D.   On: 09/21/2019 19:39    Pending Labs Unresulted Labs (From admission, onward)    Start     Ordered   09/21/19 2224  Protime-INR  Add-on,   AD     09/21/19 2223   09/21/19 1934  Urinalysis, Routine w reflex microscopic  Once,   STAT     09/21/19 1933   09/21/19 1914  Occult blood card to lab, stool Provider will collect  Once,   STAT    Question:  Specimen to be collected by:  Answer:  Provider will collect   09/21/19 1913          Vitals/Pain Today's Vitals   09/21/19 2245 09/21/19 2313 09/21/19 2315 09/21/19 2327  BP: (!) 81/63 (!) 87/65 (!) 89/62 (!) 89/65  Pulse: 78 78 87 91  Resp: 20  15 15   Temp:    (!) 97.2 F (36.2 C)  TempSrc:    Oral  SpO2: 90%  100% 95%  Weight:      Height:      PainSc:        Isolation Precautions No active isolations  Medications Medications  sodium chloride flush (NS) 0.9 % injection 3 mL (3 mLs Intravenous Not Given 09/21/19 1925)  Ampicillin-Sulbactam (UNASYN) 3 g in sodium chloride 0.9 % 100 mL IVPB (3 g Intravenous New Bag/Given 09/21/19 2333)  0.9 %  sodium chloride infusion (10 mL/hr Intravenous New Bag/Given 09/21/19 2312)  potassium chloride SA (KLOR-CON) CR tablet 40 mEq (40 mEq Oral Given 09/21/19 2051)    Mobility non-ambulatory - weak lower legs  High fall risk   Focused Assessments Neuro Assessment Handoff:  Swallow screen pass? No  Cardiac Rhythm: Normal sinus  rhythm NIH Stroke Scale ( + Modified Stroke Scale Criteria)  LOC Questions (1b. )   +: Answers one question correctly LOC Commands (1c. )   + : Performs both tasks correctly Best Gaze (2. )  +: Normal Visual (3. )  +: No visual loss Motor Arm, Left (5a. )   +: Drift Motor Arm, Right (5b. )   +: Drift Motor Leg, Left (6a. )   +: Drift Motor Leg, Right (6b. )   +: Drift Sensory (8. )   +: Normal, no sensory loss Best Language (9. )   +: No aphasia Extinction/Inattention (11.)   +: No Abnormality Modified SS Total  +: 5     Neuro Assessment: Exceptions to WDL Neuro Checks:      Last Documented NIHSS Modified Score: 5 (09/21/19 2325) Has TPA been given? No  If patient is a Neuro Trauma and patient is going to OR before floor call report to [place] nurse: [phone] or [phone]     R Recommendations: See Admitting Provider Note  Report given to:   Additional Notes:  Trachea

## 2019-09-21 NOTE — ED Notes (Signed)
Patient transported to CT 

## 2019-09-21 NOTE — ED Notes (Signed)
pts mother reports that the pt is an alcoholic and has not been eating  He has had swollen feet and legs for over a month  And he has had thick mujcous from his trach also.  [facility] the pt is alert but disoriented

## 2019-09-21 NOTE — ED Provider Notes (Signed)
I saw and evaluated the patient, reviewed the resident's note and I agree with the findings and plan.  EKG: EKG Interpretation  Date/Time:  Friday September 21 2019 18:17:48 EDT Ventricular Rate:  85 PR Interval:    QRS Duration: 110 QT Interval:  408 QTC Calculation: 486 R Axis:   72 Text Interpretation:  Sinus rhythm Atrial premature complexes in couplets Borderline prolonged QT interval Confirmed by Lacretia Leigh ([id number]) on 09/21/2019 7:6:77 PM  63 year old male who presents with worsening weakness and swelling in his extremities.  Was just discharged from the hospital yesterday.  Hemoglobin here is low at 7.2.  Does have history of black stools but none recently.  Patient has increased accretions coming out of his tracheostomy site.  Concern for possible aspiration.  Patient will be admitted to the hospital   Lacretia Leigh, MD 09/21/19 1918

## 2019-09-22 ENCOUNTER — Inpatient Hospital Stay (HOSPITAL_COMMUNITY): Payer: Medicaid Other

## 2019-09-22 ENCOUNTER — Encounter (HOSPITAL_COMMUNITY): Payer: Self-pay | Admitting: Internal Medicine

## 2019-09-22 DIAGNOSIS — E876 Hypokalemia: Secondary | ICD-10-CM | POA: Diagnosis present

## 2019-09-22 DIAGNOSIS — D649 Anemia, unspecified: Secondary | ICD-10-CM | POA: Diagnosis present

## 2019-09-22 DIAGNOSIS — E162 Hypoglycemia, unspecified: Secondary | ICD-10-CM | POA: Diagnosis present

## 2019-09-22 DIAGNOSIS — R911 Solitary pulmonary nodule: Secondary | ICD-10-CM | POA: Diagnosis present

## 2019-09-22 LAB — CBC WITH DIFFERENTIAL/PLATELET
Abs Immature Granulocytes: 0.02 10*3/uL (ref 0.00–0.07)
Basophils Absolute: 0 10*3/uL (ref 0.0–0.1)
Basophils Relative: 0 %
Eosinophils Absolute: 0.1 10*3/uL (ref 0.0–0.5)
Eosinophils Relative: 2 %
HCT: 29 % — ABNORMAL LOW (ref 39.0–52.0)
Hemoglobin: 9.9 g/dL — ABNORMAL LOW (ref 13.0–17.0)
Immature Granulocytes: 0 %
Lymphocytes Relative: 10 %
Lymphs Abs: 0.5 10*3/uL — ABNORMAL LOW (ref 0.7–4.0)
MCH: 33.3 pg (ref 26.0–34.0)
MCHC: 34.1 g/dL (ref 30.0–36.0)
MCV: 97.6 fL (ref 80.0–100.0)
Monocytes Absolute: 0.5 10*3/uL (ref 0.1–1.0)
Monocytes Relative: 11 %
Neutro Abs: 3.6 10*3/uL (ref 1.7–7.7)
Neutrophils Relative %: 77 %
Platelets: 109 10*3/uL — ABNORMAL LOW (ref 150–400)
RBC: 2.97 MIL/uL — ABNORMAL LOW (ref 4.22–5.81)
RDW: 18.1 % — ABNORMAL HIGH (ref 11.5–15.5)
WBC: 4.7 10*3/uL (ref 4.0–10.5)
nRBC: 0 % (ref 0.0–0.2)

## 2019-09-22 LAB — BODY FLUID CELL COUNT WITH DIFFERENTIAL
Eos, Fluid: 0 %
Lymphs, Fluid: 5 %
Monocyte-Macrophage-Serous Fluid: 77 % (ref 50–90)
Neutrophil Count, Fluid: 18 % (ref 0–25)
Total Nucleated Cell Count, Fluid: 39 cu mm (ref 0–1000)

## 2019-09-22 LAB — RETICULOCYTES
Immature Retic Fract: 15.9 % (ref 2.3–15.9)
RBC.: 3 MIL/uL — ABNORMAL LOW (ref 4.22–5.81)
Retic Count, Absolute: 50.7 10*3/uL (ref 19.0–186.0)
Retic Ct Pct: 1.7 % (ref 0.4–3.1)

## 2019-09-22 LAB — FOLATE: Folate: 6.3 ng/mL (ref >5.9)

## 2019-09-22 LAB — IRON AND TIBC
Iron: 47 ug/dL (ref 45–182)
Saturation Ratios: 30 % (ref 17.9–39.5)
TIBC: 158 ug/dL — ABNORMAL LOW (ref 250–450)
UIBC: 111 ug/dL

## 2019-09-22 LAB — MRSA PCR SCREENING: MRSA by PCR: POSITIVE — AB

## 2019-09-22 LAB — RESPIRATORY PANEL BY PCR

## 2019-09-22 LAB — MAGNESIUM: Magnesium: 1.8 mg/dL (ref 1.7–2.4)

## 2019-09-22 LAB — GLUCOSE, CAPILLARY
Glucose-Capillary: 71 mg/dL (ref 70–99)
Glucose-Capillary: 85 mg/dL (ref 70–99)
Glucose-Capillary: 105 mg/dL — ABNORMAL HIGH (ref 70–99)
Glucose-Capillary: 61 mg/dL — ABNORMAL LOW (ref 70–99)
Glucose-Capillary: 72 mg/dL (ref 70–99)
Glucose-Capillary: 86 mg/dL (ref 70–99)
Glucose-Capillary: 98 mg/dL (ref 70–99)
Glucose-Capillary: 60 mg/dL — ABNORMAL LOW (ref 70–99)
Glucose-Capillary: 65 mg/dL — ABNORMAL LOW (ref 70–99)
Glucose-Capillary: 89 mg/dL (ref 70–99)
Glucose-Capillary: 79 mg/dL (ref 70–99)
Glucose-Capillary: 77 mg/dL (ref 70–99)

## 2019-09-22 LAB — GRAM STAIN: Gram Stain: NONE SEEN

## 2019-09-22 LAB — COMPREHENSIVE METABOLIC PANEL
ALT: 12 U/L (ref 0–44)
AST: 31 U/L (ref 15–41)
Albumin: 2.5 g/dL — ABNORMAL LOW (ref 3.5–5.0)
Alkaline Phosphatase: 55 U/L (ref 38–126)
Anion gap: 9 (ref 5–15)
BUN: 12 mg/dL (ref 8–23)
CO2: 27 mmol/L (ref 22–32)
Calcium: 8.7 mg/dL — ABNORMAL LOW (ref 8.9–10.3)
Chloride: 109 mmol/L (ref 98–111)
Creatinine, Ser: 1.72 mg/dL — ABNORMAL HIGH (ref 0.61–1.24)
GFR calc Af Amer: 48 mL/min — ABNORMAL LOW (ref >60)
GFR calc non Af Amer: 42 mL/min — ABNORMAL LOW (ref >60)
Glucose, Bld: 92 mg/dL (ref 70–99)
Potassium: 3.2 mmol/L — ABNORMAL LOW (ref 3.5–5.1)
Sodium: 145 mmol/L (ref 135–145)
Total Bilirubin: 0.6 mg/dL (ref 0.3–1.2)
Total Protein: 6.4 g/dL — ABNORMAL LOW (ref 6.5–8.1)

## 2019-09-22 LAB — INFLUENZA PANEL BY PCR (TYPE A & B)
Influenza A By PCR: NEGATIVE
Influenza B By PCR: NEGATIVE

## 2019-09-22 LAB — HIV ANTIBODY (ROUTINE TESTING W REFLEX): HIV Screen 4th Generation wRfx: NONREACTIVE

## 2019-09-22 LAB — ALBUMIN, PLEURAL OR PERITONEAL FLUID: Albumin, Fluid: 1.5 g/dL

## 2019-09-22 LAB — PROTIME-INR
INR: 1.1 (ref 0.8–1.2)
Prothrombin Time: 14.4 seconds (ref 11.4–15.2)

## 2019-09-22 LAB — LACTATE DEHYDROGENASE, PLEURAL OR PERITONEAL FLUID: LD, Fluid: 62 U/L — ABNORMAL HIGH (ref 3–23)

## 2019-09-22 LAB — PROTEIN, PLEURAL OR PERITONEAL FLUID: Total protein, fluid: 3.3 g/dL

## 2019-09-22 LAB — GLUCOSE, PLEURAL OR PERITONEAL FLUID: Glucose, Fluid: 87 mg/dL

## 2019-09-22 LAB — TSH: TSH: 121.742 u[IU]/mL — ABNORMAL HIGH (ref 0.350–4.500)

## 2019-09-22 LAB — FERRITIN: Ferritin: 220 ng/mL (ref 24–336)

## 2019-09-22 LAB — VITAMIN B12: Vitamin B-12: 1614 pg/mL — ABNORMAL HIGH (ref 180–914)

## 2019-09-22 MED ORDER — SODIUM CHLORIDE 0.9 % IV SOLN
INTRAVENOUS | Status: DC
  Administered 2019-09-22: 04:00:00 via INTRAVENOUS

## 2019-09-22 MED ORDER — ALBUMIN HUMAN 25 % IV SOLN
12.5000 g | Freq: Once | INTRAVENOUS | Status: AC
  Administered 2019-09-22: 12.5 g via INTRAVENOUS
  Filled 2019-09-22: qty 50

## 2019-09-22 MED ORDER — LORAZEPAM 1 MG PO TABS: 1.0000 mg | ORAL_TABLET | ORAL | Status: AC | PRN

## 2019-09-22 MED ORDER — DEXTROSE-NACL 5-0.9 % IV SOLN
INTRAVENOUS | Status: DC
  Administered 2019-09-22 – 2019-09-23 (×4): via INTRAVENOUS

## 2019-09-22 MED ORDER — DEXTROSE 50 % IV SOLN
INTRAVENOUS | Status: AC
  Administered 2019-09-22: 12.5 g
  Filled 2019-09-22: qty 50

## 2019-09-22 MED ORDER — PIPERACILLIN-TAZOBACTAM 3.375 G IVPB
3.3750 g | Freq: Three times a day (TID) | INTRAVENOUS | Status: DC
  Administered 2019-09-22 – 2019-09-29 (×17): 3.375 g via INTRAVENOUS
  Filled 2019-09-22 (×27): qty 50

## 2019-09-22 MED ORDER — ONDANSETRON HCL 4 MG/2ML IJ SOLN: 4.0000 mg | Freq: Four times a day (QID) | INTRAMUSCULAR | Status: DC | PRN

## 2019-09-22 MED ORDER — ADULT MULTIVITAMIN W/MINERALS CH: 1.0000 | ORAL_TABLET | Freq: Every day | ORAL | Status: DC

## 2019-09-22 MED ORDER — GLUCAGON HCL RDNA (DIAGNOSTIC) 1 MG IJ SOLR
INTRAMUSCULAR | Status: AC
  Filled 2019-09-22: qty 1

## 2019-09-22 MED ORDER — LIDOCAINE HCL (PF) 1 % IJ SOLN
INTRAMUSCULAR | Status: AC
  Filled 2019-09-22: qty 30

## 2019-09-22 MED ORDER — DEXTROSE 50 % IV SOLN
12.5000 g | INTRAVENOUS | Status: AC
  Administered 2019-09-22: 12.5 g via INTRAVENOUS

## 2019-09-22 MED ORDER — LORAZEPAM 2 MG/ML IJ SOLN
1.0000 mg | INTRAMUSCULAR | Status: AC | PRN
  Administered 2019-09-23: 2 mg via INTRAVENOUS
  Filled 2019-09-22: qty 1

## 2019-09-22 MED ORDER — THIAMINE HCL 100 MG/ML IJ SOLN
100.0000 mg | Freq: Every day | INTRAMUSCULAR | Status: DC
  Administered 2019-09-22 – 2019-09-28 (×5): 100 mg via INTRAVENOUS
  Filled 2019-09-22 (×5): qty 2

## 2019-09-22 MED ORDER — THIAMINE HCL 100 MG/ML IJ SOLN
100.0000 mg | INTRAMUSCULAR | Status: AC
  Administered 2019-09-22: 100 mg via INTRAVENOUS
  Filled 2019-09-22: qty 2

## 2019-09-22 MED ORDER — ONDANSETRON HCL 4 MG PO TABS: 4.0000 mg | ORAL_TABLET | Freq: Four times a day (QID) | ORAL | Status: DC | PRN

## 2019-09-22 MED ORDER — VITAMIN B-1 100 MG PO TABS: 100.0000 mg | ORAL_TABLET | Freq: Every day | ORAL | Status: DC

## 2019-09-22 MED ORDER — FOLIC ACID 1 MG PO TABS: 1.0000 mg | ORAL_TABLET | Freq: Every day | ORAL | Status: DC

## 2019-09-22 MED ORDER — DEXTROSE 50 % IV SOLN
25.0000 g | INTRAVENOUS | Status: AC
  Administered 2019-09-22: 25 g via INTRAVENOUS

## 2019-09-22 MED ORDER — DEXTROSE 50 % IV SOLN
INTRAVENOUS | Status: AC
  Administered 2019-09-22: 50 mL
  Filled 2019-09-22: qty 50

## 2019-09-22 MED ORDER — POTASSIUM CHLORIDE 10 MEQ/100ML IV SOLN
10.0000 meq | INTRAVENOUS | Status: AC
  Administered 2019-09-22 (×2): 10 meq via INTRAVENOUS
  Filled 2019-09-22 (×2): qty 100

## 2019-09-22 NOTE — Progress Notes (Signed)
Modified Barium Swallow Progress Note  Patient Details  Name: Jack Mcintyre MRN: [id number] Date of Birth: [birth year]  Today's Date: 09/22/2019  Modified Barium Swallow completed.  Full report located under Chart Review in the Imaging Section.  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  Cuffless shiley #6 trach in place with PMV in place during assessment. Anatomy was difficult to fully assess and SLP could not visualize an epiglottis. All tested boluses of puree solids and thin liquids resulted in moderate to severe aspiration during and after the swallow, with boluses branching off with approximately 60% transiting through esophagus and 40% transiting into trachea. Residuals were observed at level of what would be vallecular sinus, and patient had to effortfully swallow for residiuals to transit, and again, aspiration occured. Aspiration was silent, however patient did start to reflexively cought when large amount of aspirated had transited past his vocal cords. He was able to clear minimal amount of aspirate with cued cough. He is chronically aspirating and unfortunately, no indication that improvement could be made in his swallow function.    Swallow Evaluation Recommendations       SLP Diet Recommendations: NPO       Medication Administration: Via alternative means               Oral Care Recommendations: Oral care QID        Jack Mcintyre 09/22/2019,5:55 PM   Jack Baller, MA, CCC-SLP Speech Therapy [facility] Pager: [phone]

## 2019-09-22 NOTE — Plan of Care (Signed)

## 2019-09-22 NOTE — Progress Notes (Addendum)
Patient wants to leave AMA, page Dr Doristine Bosworth. Patient is persistent. Educated patient on importance of staying to treat his pneumonia and needing a swallow study and paracentesis. Patient is refusing to stay and demanding his clothes.

## 2019-09-22 NOTE — Progress Notes (Signed)
PT Cancellation Note  Patient Details Name: Jack Mcintyre MRN: [id number] DOB: 09-09-1956   Cancelled Treatment:    Reason Eval/Treat Not Completed: Patient at procedure or test/unavailable  Patient off the unit.     Barry Brunner, PT      [name] 09/22/2019, 3:58 PM

## 2019-09-22 NOTE — ED Notes (Signed)
Per Dr. Roel Cluck, RN to increase rate IV blood and continue to monitor BP.

## 2019-09-22 NOTE — Procedures (Signed)
Ultrasound-guided diagnostic and therapeutic paracentesis performed yielding 2.2 liters of yellow  fluid. No immediate complications. A portion of the fluid was sent to the lab for preordered studies. EBL none.

## 2019-09-22 NOTE — Progress Notes (Signed)
CSW met with the patient and his mother at bedside. CSW provided a substance abuse resource list. The patient's mother is trying to encourage him to go to rehab. CSW answered her questions.   CSW will continue to follow and assist as needed.   Domenic Schwab, MSW, [place] Worker [facility]  [phone]

## 2019-09-22 NOTE — Progress Notes (Signed)
Pt CBG 60, text page Dr Doristine Bosworth requesting D10, orders sent to increase D5 to 16ml. Increased and will continue to monitor.

## 2019-09-22 NOTE — Progress Notes (Signed)
Initial Nutrition Assessment  DOCUMENTATION CODES:   Not applicable  INTERVENTION:  -Monitor for diet advancement and provide nutrition supplement as appropriate   If diet unable to be advanced and PEG is replaced, recommend initiating: -Osmolite 1.2 @ 30 ml/hr, advance as tolerated 10 ml/hr every 8 hrs to goal rate 75 ml/hr -Prostat 30 ml daily via tube  Tube feed regimen at goal rate provides 2260 kcal, 115 grams protein, and 1473 ml H20  Monitor magnesium, potassium, and phosphorus daily for at least 3 days, MD to replete as needed, as pt is at risk for refeeding syndrome given history of EtOH abuse.  NUTRITION DIAGNOSIS:   Inadequate oral intake related to dysphagia, cancer and cancer related treatments(throat cancer) as evidenced by NPO status, other (comment)(tracheostomy; previous PEG).   GOAL:   Patient will meet greater than or equal to 90% of their needs   MONITOR:   I & O's, Diet advancement, Labs, Weight trends  REASON FOR ASSESSMENT:   Consult Malnutrition Eval  ASSESSMENT:  RD working remotely.  63 year old male with medical history significant of throat cancer and radiation treatment s/p trach and PEG 2015, EtOH abuse, malnutrition, nocardia lung infection, CKD, recently admitted to [facility] 9/28-10/01 for AMS and aspiration who presents with progressive AMS, weakness, and aspiration.  Per chart review, patient has been having diarrhea for the past month with some abdominal pain and distention as well as chronic cough with sputum. Patient continues to smoke a half pack a day and drinks about a pint a day, which he has done for decades per documentation of family report.   Wife reported that she tried to give patient some medication yesterday morning and he had a lot of water come out of his tracheostomy; wife is concerned patient has been aspirating. Patient had a PEG in place, per chart review it slipped out 5-6 months ago and was never replaced. Failed swallow  evaluation in ED; noted to have significant desaturation. CXR showed extensive frothy debris vent dependent airways, nodular airspace disease likely secondary to extensive aspiration.   Unable to obtain nutrition history at this time. Given reported chronic EtOH abuse, suspect patient with ongoing inadequate nutrition intake. Will continue to monitor for SLP reccomendations and provide nutrition supplement when appropriate. Tube feed recommendations have been provided should PEG be replaced. Patient is at risk for refeeding; recommend monitoring K/Phos/Mg with diet advancement.  Current wt 74.2 kg (163.2 lb) +1; BLE No recent weight history available, noted 15.4 lb weight gain over the past year;  67.2 kg (147.8 lb) in September 2019.   I/Os: +631 ml since admit Medications and labs reviewed: Labs: CBGS 61-98 10/2 K 3.2  NUTRITION - FOCUSED PHYSICAL EXAM: Unable to complete at this time; RD working remotely.   Diet Order:   Diet Order            Diet NPO time specified  Diet effective now              EDUCATION NEEDS:   No education needs have been identified at this time  Skin:  Skin Assessment: Reviewed RN Assessment  Last BM:  10/2  Height:   Ht Readings from Last 1 Encounters:  09/21/19 6\' 1"  (1.854 m)    Weight:   Wt Readings from Last 1 Encounters:  09/22/19 74.2 kg    Ideal Body Weight:  83.6 kg  BMI:  Body mass index is 21.58 kg/m.  Estimated Nutritional Needs:   Kcal:  2200-2400  Protein:  110-120  Fluid:  2.2 L/day  Lajuan Lines, RD, LDN [facility] Telephone [phone] After Hours/Weekend Pager: [phone]

## 2019-09-22 NOTE — ED Notes (Signed)
Discussed plan of care with 2W RN Cassey. Dr. Roel Cluck does not feel like pt qualifies for ICU. Pts BP trending SBP above 90 with intermittent decreased in SBP high 80s.   Plan: to continue administration of 2 units of blood. First unit transfusing now. Rate has been increased. Floor RN to continue to monitor BP.

## 2019-09-22 NOTE — ED Notes (Signed)
Please call daughter Algis Liming @  [phone] a status update--Raiyah Speakman

## 2019-09-22 NOTE — Progress Notes (Addendum)
PROGRESS NOTE    Jack Mcintyre  [id number] DOB: [birth year] DOA: 09/21/2019 PCP: [facility], [facility]   Brief Narrative:  Jack Mcintyre is a 63 y.o. male with medical history significant of throat cancer and radiation treatment status post trach and PEG in 2015, chronic cough,, history of tobacco and alcohol abuse, history of lung infection, CKD stage III, chronic anemia was brought into the ED on 09/21/2019 by his wife with a concern of decline in his health, weakness, confusion and some shortness of breath.  Of note, he was recently admitted at the [facility] facility from September 28 through October 1.  Reportedly, he is also alcoholic and has been drinking 1 pint of liquor almost daily.  Upon arrival to emergency department, he was slightly hypotensive.  Potassium was 3.2 and hypoglycemic with blood glucose of 66.  He failed bedside swallow evaluation in the emergency department and was subsequently diagnosed with aspiration pneumonia bilateral based on the chest x-ray and CT scan of the lungs.  He was started on broad-spectrum antibiotics and then admitted under [facility] service.  He was tested negative for COVID-19.  Assessment & Plan:   Active Problems:   Acute kidney injury (HCC)   Malnutrition of moderate degree   Aspiration pneumonia (HCC)   Alcohol abuse   Hypokalemia   Hypoglycemia   Anemia   Lung nodule  Recurrent aspiration pneumonia/dysphagia: Per my discussion with SLP therapist, patient has very audible gurgling sound in the throat.  They recommend keeping him n.p.o.  They are going to order MBS.  In the meantime we will continue Zosyn and follow all the cultures.  Sputum culture negative so far.  Will order blood culture.  We will also check respiratory viral panel and influenza.  Hypokalemia: 3.2 yesterday.  Was replaced.  Repeat BMP today.  CKD stage III: Seems to have baseline creatinine of about 1.7.  Currently at baseline.  Continue to watch.  Hypoglycemia: Continue  dextrose with normal saline.  Alcohol abuse: Drinks about 1 pint of liquor every day.  High risk for withdrawal.  Continue CIWA with multivitamin and PRN Ativan.  Chronic anemia: Seems to have chronic anemia.  We only have to the lab records from May 2019 through now.  Highest hemoglobin has been 10.3.  Yesterday upon admission he was 7.2.  It is hard to find a baseline for him.  He received 1 unit of PRBC transfusion.  Will repeat CBC today.  Hypotension/dehydration: Likely secondary to infection, not being able to eat much and anemia.  Blood pressure remains labile but improving now.  Watch closely.  Lung nodule: This is known.  He follows up with pulmonology as an outpatient.  Anasarca: Due to poor p.o. intake and hypoalbuminemia.  DVT prophylaxis: We will start him on SCD.  Avoid heparin for now. Code Status: Full code Family Communication:  None present at bedside.  Plan of care discussed with patient in length and he verbalized understanding and agreed with it. Disposition Plan: TBD  Consultants:   None  Procedures:   None  Antimicrobials:   IV Zosyn 09/21/2019   Subjective: Patient seen and examined.  He was lethargic but oriented x4.  Denies any complaints such as shortness of breath or chest pain.  Objective: Vitals:   09/22/19 0430 09/22/19 0431 09/22/19 0748 09/22/19 0754  BP: (!) 85/55 (!) 85/55  114/74  Pulse: 92 93 98   Resp: 15 14 14    Temp: (!) 97.4 F (36.3 C) (!) 97.5  F (36.4 C)  97.6 F (36.4 C)  TempSrc: Oral Oral  Oral  SpO2: 96% 96% 91%   Weight:      Height:        Intake/Output Summary (Last 24 hours) at 09/22/2019 1025 Last data filed at 09/22/2019 0430 Gross per 24 hour  Intake 631 ml  Output --  Net 631 ml   Filed Weights   09/21/19 2007 09/22/19 0100  Weight: 68 kg 74.2 kg    Examination:  General exam: Appears calm and comfortable but cachectic Respiratory system: Coarse breath sounds with rhonchi bilaterally.  Respiratory  effort normal. Cardiovascular system: S1 & S2 heard, RRR. No JVD, murmurs, rubs, gallops or clicks. No pedal edema. Gastrointestinal system: Abdomen is nondistended, soft and nontender. No organomegaly or masses felt. Normal bowel sounds heard. Central nervous system: Alert and oriented. No focal neurological deficits. Extremities: Symmetric 5 x 5 power. Skin: No rashes, lesions or ulcers Psychiatry: Judgement and insight appear poor. Mood & affect appropriate.    Data Reviewed: I have personally reviewed following labs and imaging studies  CBC: Recent Labs  Lab 09/21/19 1759  WBC 2.7*  HGB 7.2*  HCT 21.5*  MCV 103.4*  PLT 123456*   Basic Metabolic Panel: Recent Labs  Lab 09/21/19 1759 09/21/19 1922  NA 144  --   K 3.2*  --   CL 107  --   CO2 27  --   GLUCOSE 74  --   BUN 13  --   CREATININE 1.67*  --   CALCIUM 8.9  --   MG  --  1.7   GFR: Estimated Creatinine Clearance: 48.1 mL/min (A) (by C-G formula based on SCr of 1.67 mg/dL (H)). Liver Function Tests: Recent Labs  Lab 09/21/19 1759  AST 39  ALT 13  ALKPHOS 66  BILITOT 0.8  PROT 6.4*  ALBUMIN 2.5*   Recent Labs  Lab 09/21/19 2045  LIPASE 36   Recent Labs  Lab 09/21/19 2040  AMMONIA 17   Coagulation Profile: Recent Labs  Lab 09/21/19 1939  INR 1.1   Cardiac Enzymes: No results for input(s): CKTOTAL, CKMB, CKMBINDEX, TROPONINI in the last 168 hours. BNP (last 3 results) No results for input(s): PROBNP in the last 8760 hours. HbA1C: No results for input(s): HGBA1C in the last 72 hours. CBG: Recent Labs  Lab 09/22/19 0500 09/22/19 0748 09/22/19 0831 09/22/19 0852 09/22/19 0923  GLUCAP 105* 61* 98 86 72   Lipid Profile: No results for input(s): CHOL, HDL, LDLCALC, TRIG, CHOLHDL, LDLDIRECT in the last 72 hours. Thyroid Function Tests: No results for input(s): TSH, T4TOTAL, FREET4, T3FREE, THYROIDAB in the last 72 hours. Anemia Panel: No results for input(s): VITAMINB12, FOLATE,  FERRITIN, TIBC, IRON, RETICCTPCT in the last 72 hours. Sepsis Labs: Recent Labs  Lab 09/21/19 1922 09/21/19 2040  LATICACIDVEN 1.1 0.9    Recent Results (from the past 240 hour(s))  SARS Coronavirus 2 [facility] order, Performed in [facility] hospital lab) Nasopharyngeal Nasopharyngeal Swab     Status: None   Collection Time: 09/21/19  8:10 PM   Specimen: Nasopharyngeal Swab  Result Value Ref Range Status   SARS Coronavirus 2 NEGATIVE NEGATIVE Final    Comment: (NOTE) If result is NEGATIVE SARS-CoV-2 target nucleic acids are NOT DETECTED. The SARS-CoV-2 RNA is generally detectable in upper and lower  respiratory specimens during the acute phase of infection. The lowest  concentration of SARS-CoV-2 viral copies this assay can detect is 250  copies / mL.  A negative result does not preclude SARS-CoV-2 infection  and should not be used as the sole basis for treatment or other  patient management decisions.  A negative result may occur with  improper specimen collection / handling, submission of specimen other  than nasopharyngeal swab, presence of viral mutation(s) within the  areas targeted by this assay, and inadequate number of viral copies  (<250 copies / mL). A negative result must be combined with clinical  observations, patient history, and epidemiological information. If result is POSITIVE SARS-CoV-2 target nucleic acids are DETECTED. The SARS-CoV-2 RNA is generally detectable in upper and lower  respiratory specimens dur ing the acute phase of infection.  Positive  results are indicative of active infection with SARS-CoV-2.  Clinical  correlation with patient history and other diagnostic information is  necessary to determine patient infection status.  Positive results do  not rule out bacterial infection or co-infection with other viruses. If result is PRESUMPTIVE POSTIVE SARS-CoV-2 nucleic acids MAY BE PRESENT.   A presumptive positive result was obtained on the  submitted specimen  and confirmed on repeat testing.  While 2019 novel coronavirus  (SARS-CoV-2) nucleic acids may be present in the submitted sample  additional confirmatory testing may be necessary for epidemiological  and / or clinical management purposes  to differentiate between  SARS-CoV-2 and other Sarbecovirus currently known to infect humans.  If clinically indicated additional testing with an alternate test  methodology [phone]) is advised. The SARS-CoV-2 RNA is generally  detectable in upper and lower respiratory sp ecimens during the acute  phase of infection. The expected result is Negative. Fact Sheet for Patients:  StrictlyIdeas.no Fact Sheet for Healthcare Providers: BankingDealers.co.za This test is not yet approved or cleared by the [place] FDA and has been authorized for detection and/or diagnosis of SARS-CoV-2 by FDA under an Emergency Use Authorization (EUA).  This EUA will remain in effect (meaning this test can be used) for the duration of the COVID-19 declaration under Section 564(b)(1) of the Act, 21 U.S.C. section 360bbb-3(b)(1), unless the authorization is terminated or revoked sooner. Performed at [facility], [place] [address] [place], [place] [address]   MRSA PCR Screening     Status: Abnormal   Collection Time: 09/22/19  1:55 AM   Specimen: Nasal Mucosa; Nasopharyngeal  Result Value Ref Range Status   MRSA by PCR POSITIVE (A) NEGATIVE Final    Comment:        The GeneXpert MRSA Assay (FDA approved for NASAL specimens only), is one component of a comprehensive MRSA colonization surveillance program. It is not intended to diagnose MRSA infection nor to guide or monitor treatment for MRSA infections. RESULT CALLED TO, READ BACK BY AND VERIFIED WITHPricilla Larsson RN 09/22/19 0420 JDW Performed at [facility] [address], [place], [place] [address]       Radiology Studies: Ct  Chest Wo Contrast  Result Date: 09/21/2019 CLINICAL DATA:  Weakness, trouble swallowing, aspirating EXAM: CT CHEST WITHOUT CONTRAST TECHNIQUE: Multidetector CT imaging of the chest was performed following the standard protocol without IV contrast. COMPARISON:  09/07/2018 FINDINGS: Cardiovascular: Aortic atherosclerosis. Normal heart size. Three-vessel coronary artery calcifications. No pericardial effusion. Mediastinum/Nodes: No enlarged mediastinal, hilar, or axillary lymph nodes. Thyroid gland, trachea, and esophagus demonstrate no significant findings. Lungs/Pleura: Moderate centrilobular emphysema. Tracheostomy. Small bilateral pleural effusions. There is extensive frothy debris within the dependent airways of the lungs bilaterally with ground-glass and clustered nodular airspace disease in the lung bases,  left greater than right. No significant change in cavitary lesions of the dependent bilateral upper lungs (series 4, image 37, 52). Interval enlargement of a somewhat spiculated appearing nodule in the 8 focal right upper lobe, measuring 1.7 x 1.3 cm, previously 1.3 x 0.6 cm (series 4, image 42). Evidence of prior left upper lobe wedge resection. Unchanged, somewhat spiculated appearing consolidation architectural distortion of the perihilar left upper lobe (series 4, image 75). Upper Abdomen: Moderate volume ascites in the partially included upper abdomen. Musculoskeletal: No chest wall mass or suspicious bone lesions identified. IMPRESSION: 1. There is extensive frothy debris within the dependent airways of the lungs bilaterally with ground-glass and clustered nodular airspace disease in the lung bases, left greater than right. Findings are consistent with extensive aspiration, which may be ongoing. 2.  Small bilateral pleural effusions. 3. Interval enlargement of a somewhat spiculated appearing nodule in the 8 focal right upper lobe, measuring 1.7 x 1.3 cm, previously 1.3 x 0.6 cm (series 4, image 42).  This finding is concerning for malignancy. Consider characterization for metabolic activity by PET-CT or alternately tissue sampling. 4. No significant change in cavitary lesions of the dependent bilateral upper lungs (series 4, image 37, 52), likely sequelae of prior infection. 5.  Tracheostomy. 6.  Emphysema (ICD10-J43.9). 7.  Coronary artery disease.  Aortic Atherosclerosis (ICD10-I70.0). 8.  Ascites in the included upper abdomen. Electronically Signed   By: Eddie Candle M.D.   On: 09/21/2019 21:55   Dg Chest Portable 1 View  Result Date: 09/21/2019 CLINICAL DATA:  Weakness and altered mental status. Shortness of breath. EXAM: PORTABLE CHEST 1 VIEW COMPARISON:  CT chest 09/07/2018 FINDINGS: Mild improvement in the right mid lung and upper lung airspace opacities compared to the chest CT of 09/07/2018. Mildly improved left upper lobe airspace opacity. Left rib deformities noted. Tracheostomy tube noted. Mildly increased hazy interstitial opacity in the lung bases, left greater than right. Heart size within normal limits. IMPRESSION: 1. Improvement in the previous airspace opacities in the upper lobes, although these have not resolved. Prior CT referenced a 1.3 cm solid pulmonary nodule which will need follow as detailed in the prior CT report. 2. Accentuated interstitial accentuation in the lung bases, cause uncertain. This could be from noncardiogenic edema or superimposed atypical pneumonia. Electronically Signed   By: Van Clines M.D.   On: 09/21/2019 19:39    Scheduled Meds:  dextrose       folic acid  1 mg Oral Daily   multivitamin with minerals  1 tablet Oral Daily   sodium chloride flush  3 mL Intravenous Once   thiamine  100 mg Oral Daily   Or   thiamine  100 mg Intravenous Daily   Continuous Infusions:  dextrose 5 % and 0.9% NaCl 50 mL/hr at 09/22/19 0638   piperacillin-tazobactam (ZOSYN)  IV       LOS: 1 day   Time spent: 37 minutes   Darliss Cheney, MD [facility]  09/22/2019, 10:25 AM   To contact the attending provider between 7A-7P or the covering provider during after hours 7P-7A, please log into the web site [email or web address] and use password [email or web address].   ADDENDUM:  2:29 PM.  I was paged few minutes ago by nurse that patient is adamant on leaving and he is asking for his home clots and has already called a taxi.  Nurses have explained to him how important it is for him to stay in the hospital to complete the treatment.  I  also spoke to him over the phone and explained him to the same thing but he remained adamant.  I then called his mother, Jack Mcintyre, who he lives with.  I did explain to her everything as well.  She was on the same page that patient should stay in the hospital.  She said that she will call him and will come to the hospital within an hour to try to convince him as well.  Patient himself is competent.  If he was to decide to leave [facility], he can.

## 2019-09-22 NOTE — Progress Notes (Signed)
OT Cancellation Note  Patient Details Name: Jack Mcintyre MRN: [id number] DOB: [birth year]   Cancelled Treatment:    Reason Eval/Treat Not Completed: Patient at procedure or test/ unavailable, per RN transport taking patient to thoracentesis. Will follow and see as able.   Delight Stare, OT Acute Rehabilitation Services Pager [phone] Office [phone]    Delight Stare 09/22/2019, 2:17 PM

## 2019-09-22 NOTE — ED Notes (Signed)
Attempted to give report to 2W, concern d/t pt hypotensive. Admitting at bedside current BP 86/63 (71).  RN to call back to floor with plan of care concerning trending low BP

## 2019-09-22 NOTE — Progress Notes (Addendum)
Patients mother called and talked to patient. He has agreed to stay for paracentesis. Transport and swat is transporting him to his procedure.

## 2019-09-23 DIAGNOSIS — E876 Hypokalemia: Secondary | ICD-10-CM

## 2019-09-23 DIAGNOSIS — E162 Hypoglycemia, unspecified: Secondary | ICD-10-CM

## 2019-09-23 LAB — BPAM RBC
Blood Product Expiration Date: 202010262359
Blood Product Expiration Date: 202010302359
ISSUE DATE / TIME: 202010022247
ISSUE DATE / TIME: 202010030412
Unit Type and Rh: 1700
Unit Type and Rh: 1700

## 2019-09-23 LAB — TYPE AND SCREEN
ABO/RH(D): B NEG
Antibody Screen: NEGATIVE
Unit division: 0
Unit division: 0

## 2019-09-23 LAB — CBC WITH DIFFERENTIAL/PLATELET
Abs Immature Granulocytes: 0.01 10*3/uL (ref 0.00–0.07)
Basophils Absolute: 0 10*3/uL (ref 0.0–0.1)
Basophils Relative: 1 %
Eosinophils Absolute: 0.1 10*3/uL (ref 0.0–0.5)
Eosinophils Relative: 3 %
HCT: 31.2 % — ABNORMAL LOW (ref 39.0–52.0)
Hemoglobin: 10.4 g/dL — ABNORMAL LOW (ref 13.0–17.0)
Immature Granulocytes: 0 %
Lymphocytes Relative: 12 %
Lymphs Abs: 0.4 10*3/uL — ABNORMAL LOW (ref 0.7–4.0)
MCH: 33.1 pg (ref 26.0–34.0)
MCHC: 33.3 g/dL (ref 30.0–36.0)
MCV: 99.4 fL (ref 80.0–100.0)
Monocytes Absolute: 0.3 10*3/uL (ref 0.1–1.0)
Monocytes Relative: 11 %
Neutro Abs: 2.4 10*3/uL (ref 1.7–7.7)
Neutrophils Relative %: 73 %
Platelets: 96 10*3/uL — ABNORMAL LOW (ref 150–400)
RBC: 3.14 MIL/uL — ABNORMAL LOW (ref 4.22–5.81)
RDW: 18.5 % — ABNORMAL HIGH (ref 11.5–15.5)
WBC: 3.3 10*3/uL — ABNORMAL LOW (ref 4.0–10.5)
nRBC: 0 % (ref 0.0–0.2)

## 2019-09-23 LAB — COMPREHENSIVE METABOLIC PANEL
ALT: 12 U/L (ref 0–44)
AST: 22 U/L (ref 15–41)
Albumin: 2 g/dL — ABNORMAL LOW (ref 3.5–5.0)
Alkaline Phosphatase: 47 U/L (ref 38–126)
Anion gap: 8 (ref 5–15)
BUN: 14 mg/dL (ref 8–23)
CO2: 24 mmol/L (ref 22–32)
Calcium: 8.3 mg/dL — ABNORMAL LOW (ref 8.9–10.3)
Chloride: 114 mmol/L — ABNORMAL HIGH (ref 98–111)
Creatinine, Ser: 1.65 mg/dL — ABNORMAL HIGH (ref 0.61–1.24)
GFR calc Af Amer: 51 mL/min — ABNORMAL LOW (ref >60)
GFR calc non Af Amer: 44 mL/min — ABNORMAL LOW (ref >60)
Glucose, Bld: 84 mg/dL (ref 70–99)
Potassium: 3.2 mmol/L — ABNORMAL LOW (ref 3.5–5.1)
Sodium: 146 mmol/L — ABNORMAL HIGH (ref 135–145)
Total Bilirubin: 0.8 mg/dL (ref 0.3–1.2)
Total Protein: 5.8 g/dL — ABNORMAL LOW (ref 6.5–8.1)

## 2019-09-23 LAB — GLUCOSE, CAPILLARY
Glucose-Capillary: 103 mg/dL — ABNORMAL HIGH (ref 70–99)
Glucose-Capillary: 63 mg/dL — ABNORMAL LOW (ref 70–99)
Glucose-Capillary: 78 mg/dL (ref 70–99)
Glucose-Capillary: 79 mg/dL (ref 70–99)
Glucose-Capillary: 79 mg/dL (ref 70–99)
Glucose-Capillary: 80 mg/dL (ref 70–99)
Glucose-Capillary: 82 mg/dL (ref 70–99)

## 2019-09-23 MED ORDER — NICOTINE 14 MG/24HR TD PT24
14.0000 mg | MEDICATED_PATCH | Freq: Every day | TRANSDERMAL | Status: DC
  Administered 2019-09-23 – 2019-10-01 (×9): 14 mg via TRANSDERMAL
  Filled 2019-09-23 (×9): qty 1

## 2019-09-23 NOTE — Progress Notes (Signed)
Msg left on mom's phone Jack Mcintyre) to call unit. Called Daughter Sherrie to ask about pt's phone and inform her that we placed her dad in wrist restraints because he decanulated himself this morning. She stated her dad hasn't had his phone here it is at home and thanked me for calling her to inform her of the restraints and why he was placed in the restraints. Sherrie stated she will be here later this morning.

## 2019-09-23 NOTE — Progress Notes (Signed)
Pt calming down but still restless

## 2019-09-23 NOTE — Progress Notes (Signed)
Patient due to void and could not.  Bladder scan revealed he was retaining 375 mL of urine.  Straight cathed patient with charge nurse, 370 removed.

## 2019-09-23 NOTE — Progress Notes (Signed)
Mom at bedside. Pt stated he's missing his cell phone. Discussed with Danielle RN CN and she stated CN on day shift made a report about it and they think he took to IR for his procedure earlier today and it got left there. They attempted to call IR but staff had left for the day. They will attempt in the morning. Information given to pt and his mom. Pt insisting on eating and drinking. Pt has a Trach, pulled out his GT and refuses to have it replaced and has failed the BS Swallow Study and the Swallow Study done earlier today by SLP. Pt and his mom informed everything he eats and drinks will go directly to his lungs and he will continue to have Aspiration Pneumonia until one time it won't be able to be treated and it will be bad enough that he will have to go on the Vent. Pt stated he didn't want that. I told pt and his mother that if pt insisted on eating and drinking he needed to discuss this with the doctor in the morning and change his Code Status because the doctors aren't going to allow him to eat and drink because of the Aspiration. Mom stated when pt discusses this with the doctor she wants his daughter to be here instead of her. She stated the daughter is a Marine scientist at [employer] and has been discussing this with the pt. I told both of them I would pass this on to the [facility] and to the day shift nurse so they can inform the doctor in the morning.

## 2019-09-23 NOTE — Progress Notes (Signed)
Pt has been agitated, restless, irritable, and non compliant. Pt continues to attempt to get out of bed, insist on staff finding his phone tonight (has been explained numerous times it may be in IR and we can't call them until morning), and he be allowed to eat and drink because it's been days and he's hungry (explained to pt multiple times by multiple staff members he's NPO and that won't be changed until at least later on today when he talks to the doctor about changing his Code Status and informing the doctor that is the most important thing for him and that he's refusing the GT). Pt given 2 mg IV Ativan for ETOH withdrawal.

## 2019-09-23 NOTE — Progress Notes (Signed)
Resting with eyes closed.  No distress noted.

## 2019-09-23 NOTE — Evaluation (Signed)
Physical Therapy Evaluation Patient Details Name: Jack Mcintyre MRN: [id number] DOB: Sep 19, 1956 Today's Date: 09/23/2019   History of Present Illness  63 y.o. male with medical history significant of throat cancer and radiation treatment status post trach and PEG this 2015, chronic cough,, history of tobacco and alcohol abuse, history of nocardia lung infection, CKD, anemia. Presented to ED 09/21/19 with progressive worsening has been having weakness altered mental status and aspiration recently admitted to [facility] and then continued to decline from there.  He has a chronic cough still productive of sputum continues to drink alcohol smokes half a pack a day. He had a PEG  in place but has been out 5-6 months ago it slipped out and never got replaced. He was tested negative for COVID-19.  Clinical Impression   Pt admitted with above diagnosis. Patient had declining strength and mobility for several months per H&P. He currently requires 2 person assist to transfer bed to chair and is not yet ambulatory. Uncertain level of assist family can provide.  Pt currently with functional limitations due to the deficits listed below (see PT Problem List). Pt will benefit from skilled PT to increase their independence and safety with mobility to allow discharge to the venue listed below.       Follow Up Recommendations SNF;Supervision/Assistance - 24 hour(unsure family can care for pt at home with current needs)    Equipment Recommendations  Other (comment)(TBA)    Recommendations for Other Services OT consult     Precautions / Restrictions Precautions Precautions: Fall;Other (comment) Precaution Comments: has decannulated himself when agitated      Mobility  Bed Mobility Overal bed mobility: Needs Assistance Bed Mobility: Rolling;Sidelying to Sit;Sit to Supine Rolling: Min assist Sidelying to sit: Mod assist;HOB elevated   Sit to supine: Min assist   General bed mobility comments: slow to  process  Transfers Overall transfer level: Needs assistance Equipment used: None Transfers: Sit to/from Stand Sit to Stand: Mod assist         General transfer comment: pt bracing posterior calves against bed frame due to posterior bias;  Ambulation/Gait             General Gait Details: unable with +1 assist  Stairs            Wheelchair Mobility    Modified Rankin (Stroke Patients Only)       Balance Overall balance assessment: Needs assistance Sitting-balance support: Single extremity supported;Feet supported Sitting balance-Leahy Scale: Poor   Postural control: Posterior lean Standing balance support: No upper extremity supported Standing balance-Leahy Scale: Zero Standing balance comment: strong posterior lean                             Pertinent Vitals/Pain Pain Assessment: Faces Faces Pain Scale: No hurt    Home Living Family/patient expects to be discharged to:: Unsure Living Arrangements: Spouse/significant other;Children               Additional Comments: unclear if family can provide level of care he needs    Prior Function           Comments: Unclear, has been declining in strength for several months per chart     Hand Dominance        Extremity/Trunk Assessment   Upper Extremity Assessment Upper Extremity Assessment: Generalized weakness    Lower Extremity Assessment Lower Extremity Assessment: Generalized weakness    Cervical / Trunk Assessment Cervical /  Trunk Assessment: Other exceptions Cervical / Trunk Exceptions: cachectic  Communication   Communication: Tracheostomy(has PMV but very gurgly)  Cognition Arousal/Alertness: Awake/alert Behavior During Therapy: WFL for tasks assessed/performed Overall Cognitive Status: No family/caregiver present to determine baseline cognitive functioning                                 General Comments: following commands with multi-modal cues       General Comments General comments (skin integrity, edema, etc.): Pt coughing up thick phlegm (yellowish) and exits below his trach. RN aware    Exercises     Assessment/Plan    PT Assessment Patient needs continued PT services  PT Problem List Decreased strength;Decreased balance;Decreased mobility;Decreased cognition;Decreased knowledge of use of DME;Decreased safety awareness;Cardiopulmonary status limiting activity       PT Treatment Interventions DME instruction;Gait training;Functional mobility training;Therapeutic activities;Therapeutic exercise;Balance training;Neuromuscular re-education;Cognitive remediation;Patient/family education    PT Goals (Current goals can be found in the Care Plan section)  Acute Rehab PT Goals Patient Stated Goal: unable to state PT Goal Formulation: Patient unable to participate in goal setting Time For Goal Achievement: 10/07/19 Potential to Achieve Goals: Good    Frequency Min 3X/week(until d/c plan confirmed)   Barriers to discharge Decreased caregiver support unknow level of assist family can provide    Co-evaluation               AM-PAC PT "6 Clicks" Mobility  Outcome Measure Help needed turning from your back to your side while in a flat bed without using bedrails?: A Little Help needed moving from lying on your back to sitting on the side of a flat bed without using bedrails?: A Lot Help needed moving to and from a bed to a chair (including a wheelchair)?: Total Help needed standing up from a chair using your arms (e.g., wheelchair or bedside chair)?: Total Help needed to walk in hospital room?: Total Help needed climbing 3-5 steps with a railing? : Total 6 Click Score: 9    End of Session Equipment Utilized During Treatment: Gait belt;Oxygen Activity Tolerance: Patient tolerated treatment well Patient left: in bed;with call bell/phone within reach;with bed alarm set Nurse Communication: Mobility status PT Visit  Diagnosis: Muscle weakness (generalized) (M62.81);Unsteadiness on feet (R26.81)    Time: [id number] PT Time Calculation (min) (ACUTE ONLY): 30 min   Charges:   PT Evaluation $PT Eval Moderate Complexity: 1 Mod PT Treatments $Therapeutic Activity: 8-22 mins          Barry Brunner, PT      Jeanie Cooks Aneesha Holloran 09/23/2019, 5:29 PM

## 2019-09-23 NOTE — Progress Notes (Signed)
PROGRESS NOTE    Jack Mcintyre  [id number] DOB: [birth year] DOA: 09/21/2019 PCP: [facility]   Brief Narrative:  Jack Mcintyre is a 63 y.o. male with medical history significant of throat cancer and radiation treatment status post trach and PEG in 2015, chronic cough,, history of tobacco and alcohol abuse, history of lung infection, CKD stage III, chronic anemia was brought into the ED on 09/21/2019 by his wife with a concern of decline in his health, weakness, confusion and some shortness of breath.  Of note, he was recently admitted at the [facility] facility from September 28 through October 1.  Reportedly, he is also alcoholic and has been drinking 1 pint of liquor almost daily.  Upon arrival to emergency department, he was slightly hypotensive.  Potassium was 3.2 and hypoglycemic with blood glucose of 66.  He failed bedside swallow evaluation in the emergency department and was subsequently diagnosed with aspiration pneumonia bilateral based on the chest x-ray and CT scan of the lungs.  He was started on broad-spectrum antibiotics and then admitted under [facility] service.  He was tested negative for COVID-19.  Antibiotics for pneumonia were continued.  He was observed to have significant aspiration and gurgling sound in his throat.  SLP was consulted.  MBS was done and he was deemed to have moderate aspiration risk and they recommended alternate means of nutrition.  He refused for PEG tube yesterday however he agreed for it today.  IR consulted.  Assessment & Plan:   Active Problems:   Acute kidney injury (HCC)   Malnutrition of moderate degree   Aspiration pneumonia (HCC)   Alcohol abuse   Hypokalemia   Hypoglycemia   Anemia   Lung nodule  Recurrent aspiration pneumonia/dysphagia: Evaluated by SLP.  MBS done.  Has moderate aspiration risk.  SLP recommends alternative routes for nutrition such as PEG tube placement.  Per SLP therapist, patient had refused that yesterday.  When I spoke  to the patient personally today, he was slightly lethargic but he agreed to proceed with PEG tube placement.  I tried to call his mother, Ms. Schirmer with no answer.  I have consulted IR for PEG tube placement.  Continue current IV antibiotics which is Zosyn.  Cultures negative so far.   Hypokalemia: 3.2 yesterday.  Morning labs pending.  We will give him another dose of 40 mEq p.o. x1.  CKD stage III: Seems to have baseline creatinine of about 1.7.  Currently at baseline.  Continue to watch.  Hypoglycemia: Blood sugar fairly controlled and within normal range with dextrose 10% in normal saline running 800 cc/h.  Alcohol abuse: Drinks about 1 pint of liquor every day.  High risk for withdrawal.  Continue CIWA with multivitamin and PRN Ativan.  No signs of withdrawal so far.  Chronic anemia: Seems to have chronic anemia.  We only have to the lab records from May 2019 through now.  Highest hemoglobin has been 10.3.  Yesterday upon admission he was 7.2.  It is hard to find a baseline for him.  He received 1 unit of PRBC transfusion on 09/21/2019.  Repeat hemoglobin yesterday was 9.9.  CBC from this morning pending.  Hypotension/dehydration: Blood pressure fluctuating but mostly in the low normal side.  Baseline unknown.  Watch closely.  Lung nodule: This is known.  He follows up with pulmonology as an outpatient.  Anasarca: Due to poor p.o. intake and hypoalbuminemia.  Ascites: Status post 2 L of paracentesis on 09/22/2019.  Fluid so  far looks aseptic.  Culture negative so far.  No abdominal pain or tenderness on exam.  DVT prophylaxis: We will start him on SCD.  Avoid heparin for now. Code Status: Full code Family Communication:  None present at bedside.  Plan of care discussed with patient in length and he verbalized understanding and agreed with it. Disposition Plan: TBD  Consultants:   None  Procedures:   None  Antimicrobials:   IV Zosyn 09/21/2019   Subjective: Patient seen and  examined.  He was once again lethargic like he was yesterday.  Denied any complaint.  He agreed to proceed with PEG tube placement.  Objective: Vitals:   09/23/19 0803 09/23/19 0900 09/23/19 1134 09/23/19 1135  BP: 96/75 104/75 94/79   Pulse: 75 72 71 79  Resp: 14 13 13 14   Temp:   97.8 F (36.6 C)   TempSrc:   Axillary   SpO2: 97% 100% 100%   Weight:      Height:        Intake/Output Summary (Last 24 hours) at 09/23/2019 1159 Last data filed at 09/23/2019 0500 Gross per 24 hour  Intake 1600 ml  Output --  Net 1600 ml   Filed Weights   09/21/19 2007 09/22/19 0100  Weight: 68 kg 74.2 kg    Examination:  General exam: Appears lethargic Respiratory system: Rhonchi bilaterally.  Trach in place.  Has mucus drainage coming out of the trach. Cardiovascular system: S1 & S2 heard, RRR. No JVD, murmurs, rubs, gallops or clicks. No pedal edema. Gastrointestinal system: Abdomen is nondistended, soft and nontender. No organomegaly or masses felt. Normal bowel sounds heard. Central nervous system: Lethargic. No focal neurological deficits. Extremities: Symmetric 5 x 5 power. Skin: No rashes, lesions or ulcers.  Psychiatry: Unable to assess due to lethargy.  Data Reviewed: I have personally reviewed following labs and imaging studies  CBC: Recent Labs  Lab 09/21/19 1759 09/22/19 1129  WBC 2.7* 4.7  NEUTROABS  --  3.6  HGB 7.2* 9.9*  HCT 21.5* 29.0*  MCV 103.4* 97.6  PLT 105* 0000000*   Basic Metabolic Panel: Recent Labs  Lab 09/21/19 1759 09/21/19 1922 09/22/19 1129  NA 144  --  145  K 3.2*  --  3.2*  CL 107  --  109  CO2 27  --  27  GLUCOSE 74  --  92  BUN 13  --  12  CREATININE 1.67*  --  1.72*  CALCIUM 8.9  --  8.7*  MG  --  1.7 1.8   GFR: Estimated Creatinine Clearance: 46.7 mL/min (A) (by C-G formula based on SCr of 1.72 mg/dL (H)). Liver Function Tests: Recent Labs  Lab 09/21/19 1759 09/22/19 1129  AST 39 31  ALT 13 12  ALKPHOS 66 55  BILITOT 0.8 0.6   PROT 6.4* 6.4*  ALBUMIN 2.5* 2.5*   Recent Labs  Lab 09/21/19 2045  LIPASE 36   Recent Labs  Lab 09/21/19 2040  AMMONIA 17   Coagulation Profile: Recent Labs  Lab 09/21/19 1939  INR 1.1   Cardiac Enzymes: No results for input(s): CKTOTAL, CKMB, CKMBINDEX, TROPONINI in the last 168 hours. BNP (last 3 results) No results for input(s): PROBNP in the last 8760 hours. HbA1C: No results for input(s): HGBA1C in the last 72 hours. CBG: Recent Labs  Lab 09/22/19 2028 09/23/19 0026 09/23/19 0422 09/23/19 0801 09/23/19 1133  GLUCAP 77 80 79 79 63*   Lipid Profile: No results for input(s): CHOL, HDL, LDLCALC,  TRIG, CHOLHDL, LDLDIRECT in the last 72 hours. Thyroid Function Tests: Recent Labs    09/22/19 1129  TSH 121.742*   Anemia Panel: Recent Labs    09/22/19 1129  VITAMINB12 1,614*  FOLATE 6.3  FERRITIN 220  TIBC 158*  IRON 47  RETICCTPCT 1.7   Sepsis Labs: Recent Labs  Lab 09/21/19 1922 09/21/19 2040  LATICACIDVEN 1.1 0.9    Recent Results (from the past 240 hour(s))  SARS Coronavirus 2 [facility] order, Performed in [facility] hospital lab) Nasopharyngeal Nasopharyngeal Swab     Status: None   Collection Time: 09/21/19  8:10 PM   Specimen: Nasopharyngeal Swab  Result Value Ref Range Status   SARS Coronavirus 2 NEGATIVE NEGATIVE Final    Comment: (NOTE) If result is NEGATIVE SARS-CoV-2 target nucleic acids are NOT DETECTED. The SARS-CoV-2 RNA is generally detectable in upper and lower  respiratory specimens during the acute phase of infection. The lowest  concentration of SARS-CoV-2 viral copies this assay can detect is 250  copies / mL. A negative result does not preclude SARS-CoV-2 infection  and should not be used as the sole basis for treatment or other  patient management decisions.  A negative result may occur with  improper specimen collection / handling, submission of specimen other  than nasopharyngeal swab, presence of viral mutation(s)  within the  areas targeted by this assay, and inadequate number of viral copies  (<250 copies / mL). A negative result must be combined with clinical  observations, patient history, and epidemiological information. If result is POSITIVE SARS-CoV-2 target nucleic acids are DETECTED. The SARS-CoV-2 RNA is generally detectable in upper and lower  respiratory specimens dur ing the acute phase of infection.  Positive  results are indicative of active infection with SARS-CoV-2.  Clinical  correlation with patient history and other diagnostic information is  necessary to determine patient infection status.  Positive results do  not rule out bacterial infection or co-infection with other viruses. If result is PRESUMPTIVE POSTIVE SARS-CoV-2 nucleic acids MAY BE PRESENT.   A presumptive positive result was obtained on the submitted specimen  and confirmed on repeat testing.  While 2019 novel coronavirus  (SARS-CoV-2) nucleic acids may be present in the submitted sample  additional confirmatory testing may be necessary for epidemiological  and / or clinical management purposes  to differentiate between  SARS-CoV-2 and other Sarbecovirus currently known to infect humans.  If clinically indicated additional testing with an alternate test  methodology [phone]) is advised. The SARS-CoV-2 RNA is generally  detectable in upper and lower respiratory sp ecimens during the acute  phase of infection. The expected result is Negative. Fact Sheet for Patients:  StrictlyIdeas.no Fact Sheet for Healthcare Providers: BankingDealers.co.za This test is not yet approved or cleared by the [place] FDA and has been authorized for detection and/or diagnosis of SARS-CoV-2 by FDA under an Emergency Use Authorization (EUA).  This EUA will remain in effect (meaning this test can be used) for the duration of the COVID-19 declaration under Section 564(b)(1) of the Act,  21 U.S.C. section 360bbb-3(b)(1), unless the authorization is terminated or revoked sooner. Performed at [facility], [place] [address] [place], [place] [address]   MRSA PCR Screening     Status: Abnormal   Collection Time: 09/22/19  1:55 AM   Specimen: Nasal Mucosa; Nasopharyngeal  Result Value Ref Range Status   MRSA by PCR POSITIVE (A) NEGATIVE Final    Comment:  The GeneXpert MRSA Assay (FDA approved for NASAL specimens only), is one component of a comprehensive MRSA colonization surveillance program. It is not intended to diagnose MRSA infection nor to guide or monitor treatment for MRSA infections. RESULT CALLED TO, READ BACK BY AND VERIFIED WITHPricilla Larsson RN 09/22/19 0420 JDW Performed at [facility] [address] [place], [place] [address]   Culture, blood (routine x 2)     Status: None (Preliminary result)   Collection Time: 09/22/19 11:58 AM   Specimen: BLOOD RIGHT ARM  Result Value Ref Range Status   Specimen Description BLOOD RIGHT ARM  Final   Special Requests   Final    BOTTLES DRAWN AEROBIC AND ANAEROBIC Blood Culture adequate volume   Culture   Final    NO GROWTH < 24 HOURS Performed at [facility], [place] [address] [place], [place] [address]    Report Status PENDING  Incomplete  Culture, blood (routine x 2)     Status: None (Preliminary result)   Collection Time: 09/22/19 11:58 AM   Specimen: BLOOD RIGHT ARM  Result Value Ref Range Status   Specimen Description BLOOD RIGHT ARM  Final   Special Requests   Final    BOTTLES DRAWN AEROBIC AND ANAEROBIC Blood Culture adequate volume   Culture   Final    NO GROWTH < 24 HOURS Performed at [facility], [place] [address], [place], [place] [address]    Report Status PENDING  Incomplete  Respiratory Panel by PCR     Status: None   Collection Time: 09/22/19 12:33 PM   Specimen: Nasopharyngeal Swab; Respiratory  Result Value Ref Range Status   Adenovirus NOT DETECTED NOT DETECTED  Final   Coronavirus 229E NOT DETECTED NOT DETECTED Final    Comment: (NOTE) The Coronavirus on the Respiratory Panel, DOES NOT test for the novel  Coronavirus (2019 nCoV)    Coronavirus HKU1 NOT DETECTED NOT DETECTED Final   Coronavirus NL63 NOT DETECTED NOT DETECTED Final   Coronavirus OC43 NOT DETECTED NOT DETECTED Final   Metapneumovirus NOT DETECTED NOT DETECTED Final   Rhinovirus / Enterovirus NOT DETECTED NOT DETECTED Final   Influenza A NOT DETECTED NOT DETECTED Final   Influenza B NOT DETECTED NOT DETECTED Final   Parainfluenza Virus 1 NOT DETECTED NOT DETECTED Final   Parainfluenza Virus 2 NOT DETECTED NOT DETECTED Final   Parainfluenza Virus 3 NOT DETECTED NOT DETECTED Final   Parainfluenza Virus 4 NOT DETECTED NOT DETECTED Final   Respiratory Syncytial Virus NOT DETECTED NOT DETECTED Final   Bordetella pertussis NOT DETECTED NOT DETECTED Final   Chlamydophila pneumoniae NOT DETECTED NOT DETECTED Final   Mycoplasma pneumoniae NOT DETECTED NOT DETECTED Final    Comment: Performed at [facility], [place]. [address] [place], [place] [address]  Gram stain     Status: None   Collection Time: 09/22/19  3:35 PM   Specimen: PATH Cytology Peritoneal fluid  Result Value Ref Range Status   Specimen Description PERITONEAL  Final   Special Requests NONE  Final   Gram Stain   Final    NO WBC SEEN NO ORGANISMS SEEN Performed at [facility], [address], [place], [place] [address]    Report Status 09/22/2019 FINAL  Final  Culture, body fluid-bottle     Status: None (Preliminary result)   Collection Time: 09/22/19  3:35 PM   Specimen: Peritoneal Washings  Result Value Ref Range Status  Specimen Description PERITONEAL  Final   Special Requests NONE  Final   Culture   Final    NO GROWTH < 24 HOURS Performed at [facility], [place] [address], [place], [place] [address]    Report Status PENDING  Incomplete      Radiology Studies: Ct Chest Wo Contrast  Result  Date: 09/21/2019 CLINICAL DATA:  Weakness, trouble swallowing, aspirating EXAM: CT CHEST WITHOUT CONTRAST TECHNIQUE: Multidetector CT imaging of the chest was performed following the standard protocol without IV contrast. COMPARISON:  09/07/2018 FINDINGS: Cardiovascular: Aortic atherosclerosis. Normal heart size. Three-vessel coronary artery calcifications. No pericardial effusion. Mediastinum/Nodes: No enlarged mediastinal, hilar, or axillary lymph nodes. Thyroid gland, trachea, and esophagus demonstrate no significant findings. Lungs/Pleura: Moderate centrilobular emphysema. Tracheostomy. Small bilateral pleural effusions. There is extensive frothy debris within the dependent airways of the lungs bilaterally with ground-glass and clustered nodular airspace disease in the lung bases, left greater than right. No significant change in cavitary lesions of the dependent bilateral upper lungs (series 4, image 37, 52). Interval enlargement of a somewhat spiculated appearing nodule in the 8 focal right upper lobe, measuring 1.7 x 1.3 cm, previously 1.3 x 0.6 cm (series 4, image 42). Evidence of prior left upper lobe wedge resection. Unchanged, somewhat spiculated appearing consolidation architectural distortion of the perihilar left upper lobe (series 4, image 75). Upper Abdomen: Moderate volume ascites in the partially included upper abdomen. Musculoskeletal: No chest wall mass or suspicious bone lesions identified. IMPRESSION: 1. There is extensive frothy debris within the dependent airways of the lungs bilaterally with ground-glass and clustered nodular airspace disease in the lung bases, left greater than right. Findings are consistent with extensive aspiration, which may be ongoing. 2.  Small bilateral pleural effusions. 3. Interval enlargement of a somewhat spiculated appearing nodule in the 8 focal right upper lobe, measuring 1.7 x 1.3 cm, previously 1.3 x 0.6 cm (series 4, image 42). This finding is concerning for  malignancy. Consider characterization for metabolic activity by PET-CT or alternately tissue sampling. 4. No significant change in cavitary lesions of the dependent bilateral upper lungs (series 4, image 37, 52), likely sequelae of prior infection. 5.  Tracheostomy. 6.  Emphysema (ICD10-J43.9). 7.  Coronary artery disease.  Aortic Atherosclerosis (ICD10-I70.0). 8.  Ascites in the included upper abdomen. Electronically Signed   By: Eddie Candle M.D.   On: 09/21/2019 21:55   US Paracentesis  Result Date: 09/23/2019 INDICATION: Patient with history of throat cancer, aspiration pneumonia, chronic kidney disease, alcohol/tobacco abuse, ascites. Request made for diagnostic and therapeutic paracentesis. EXAM: ULTRASOUND GUIDED DIAGNOSTIC AND THERAPEUTIC PARACENTESIS MEDICATIONS: None COMPLICATIONS: None immediate. PROCEDURE: Informed written consent was obtained from the patient after a discussion of the risks, benefits and alternatives to treatment. A timeout was performed prior to the initiation of the procedure. Initial ultrasound scanning demonstrates a small to moderate amount of ascites within the right upper to mid abdominal quadrant. The right upper to mid abdomen was prepped and draped in the usual sterile fashion. 1% lidocaine was used for local anesthesia. Following this, a 19 gauge, 7-cm, Yueh catheter was introduced. An ultrasound image was saved for documentation purposes. The paracentesis was performed. The catheter was removed and a dressing was applied. The patient tolerated the procedure well without immediate post procedural complication. FINDINGS: A total of approximately 2.2 liters of yellow fluid was removed. Samples were sent to the laboratory as requested by the clinical team. IMPRESSION: Successful ultrasound-guided diagnostic and therapeutic paracentesis yielding 2.2 liters  of peritoneal fluid. Read by: Rowe Robert, PA-C Electronically Signed   By: Corrie Mckusick D.O.   On: 09/22/2019 15:55    Dg Chest Portable 1 View  Result Date: 09/21/2019 CLINICAL DATA:  Weakness and altered mental status. Shortness of breath. EXAM: PORTABLE CHEST 1 VIEW COMPARISON:  CT chest 09/07/2018 FINDINGS: Mild improvement in the right mid lung and upper lung airspace opacities compared to the chest CT of 09/07/2018. Mildly improved left upper lobe airspace opacity. Left rib deformities noted. Tracheostomy tube noted. Mildly increased hazy interstitial opacity in the lung bases, left greater than right. Heart size within normal limits. IMPRESSION: 1. Improvement in the previous airspace opacities in the upper lobes, although these have not resolved. Prior CT referenced a 1.3 cm solid pulmonary nodule which will need follow as detailed in the prior CT report. 2. Accentuated interstitial accentuation in the lung bases, cause uncertain. This could be from noncardiogenic edema or superimposed atypical pneumonia. Electronically Signed   By: Van Clines M.D.   On: 09/21/2019 19:39   Dg Swallowing Func-speech Pathology  Result Date: 09/22/2019 Objective Swallowing Evaluation: Type of Study: MBS-Modified Barium Swallow Study  Patient Details Name: CHRISTINE SCHWEITZER MRN: [id number] Date of Birth: 1956-06-16 Today's Date: 09/22/2019 Time: SLP Start Time (ACUTE ONLY): 1555 -SLP Stop Time (ACUTE ONLY): 1615 SLP Time Calculation (min) (ACUTE ONLY): 20 min Past Medical History: Past Medical History: Diagnosis Date  Cancer [facility])  Past Surgical History: No past surgical history on file. HPI: Patient is a 63 y.o. male with PMH: throat cancer and radiation treatment s/p trach and PEG in 2015,  chronic cough, h/o tobacco and alcohol abuse, h/o lung infection, CKD stage III, chronic anemia, who was brought to ED on 10/2 with decline in health, weakness, confusion and some SOB. Patient was recent admit to [place] from 9/28 to 10/1. CXR revealed aspiration PNA bilaterally; Covid 19 test negative. Patient's PEG tube "fell out" 5-weeks and has  not been replaced.  Subjective: pleasant, did not seem to be the best historian Assessment / Plan / Recommendation CHL IP CLINICAL IMPRESSIONS 09/22/2019 Clinical Impression Patient presents with a severe pharyngeal dysphagia which is both sensory and motor based with significant impact from anatomy s/p chemoradiation in 2015.  Cuffless shiley #6 trach in place with PMV in place during assessment. Anatomy was difficult to fully assess and SLP could not visualize an epiglottis. All tested boluses of puree solids and thin liquids resulted in moderate to severe aspiration during and after the swallow, with boluses branching off with approximately 60% transiting through esophagus and 40% transiting into trachea. Residuals were observed at level of what would be vallecular sinus, and patient had to effortfully swallow for residiuals to transit, and again, aspiration occured. Aspiration was silent, however patient did start to reflexively cought when large amount of aspirated had transited past his vocal cords. He was able to clear minimal amount of aspirate with cued cough. He is chronically aspirating and unfortunately, no indication that improvement could be made in his swallow function.  SLP Visit Diagnosis Dysphagia, pharyngeal phase (R13.13) Attention and concentration deficit following -- Frontal lobe and executive function deficit following -- Impact on safety and function Severe aspiration risk;Risk for inadequate nutrition/hydration   CHL IP TREATMENT RECOMMENDATION 09/22/2019 Treatment Recommendations Therapy as outlined in treatment plan below   Prognosis 09/22/2019 Prognosis for Safe Diet Advancement Guarded Barriers to Reach Goals -- Barriers/Prognosis Comment -- CHL IP DIET RECOMMENDATION 09/22/2019 SLP Diet Recommendations NPO Liquid Administration via --  Medication Administration Via alternative means Compensations -- Postural Changes --   CHL IP OTHER RECOMMENDATIONS 09/22/2019 Recommended Consults -- Oral  Care Recommendations Oral care QID Other Recommendations --   CHL IP FOLLOW UP RECOMMENDATIONS 09/22/2019 Follow up Recommendations None   CHL IP FREQUENCY AND DURATION 09/22/2019 Speech Therapy Frequency (ACUTE ONLY) min 1 x/week Treatment Duration 1 week      CHL IP ORAL PHASE 09/22/2019 Oral Phase WFL Oral - Pudding Teaspoon -- Oral - Pudding Cup -- Oral - Honey Teaspoon -- Oral - Honey Cup -- Oral - Nectar Teaspoon -- Oral - Nectar Cup -- Oral - Nectar Straw -- Oral - Thin Teaspoon -- Oral - Thin Cup -- Oral - Thin Straw -- Oral - Puree -- Oral - Mech Soft -- Oral - Regular -- Oral - Multi-Consistency -- Oral - Pill -- Oral Phase - Comment --  CHL IP PHARYNGEAL PHASE 09/22/2019 Pharyngeal Phase Impaired Pharyngeal- Pudding Teaspoon -- Pharyngeal -- Pharyngeal- Pudding Cup -- Pharyngeal -- Pharyngeal- Honey Teaspoon -- Pharyngeal -- Pharyngeal- Honey Cup -- Pharyngeal -- Pharyngeal- Nectar Teaspoon -- Pharyngeal -- Pharyngeal- Nectar Cup -- Pharyngeal -- Pharyngeal- Nectar Straw -- Pharyngeal -- Pharyngeal- Thin Teaspoon -- Pharyngeal -- Pharyngeal- Thin Cup Delayed swallow initiation-vallecula;Reduced pharyngeal peristalsis;Reduced airway/laryngeal closure;Penetration/Aspiration during swallow;Penetration/Apiration after swallow;Moderate aspiration;Significant aspiration (Amount);Pharyngeal residue - valleculae Pharyngeal Material enters airway, passes BELOW cords without attempt by patient to eject out (silent aspiration) Pharyngeal- Thin Straw Delayed swallow initiation-vallecula;Reduced airway/laryngeal closure;Penetration/Aspiration during swallow;Penetration/Apiration after swallow;Significant aspiration (Amount);Moderate aspiration Pharyngeal Material enters airway, passes BELOW cords without attempt by patient to eject out (silent aspiration) Pharyngeal- Puree Delayed swallow initiation-vallecula;Pharyngeal residue - valleculae;Reduced airway/laryngeal closure;Penetration/Aspiration during  swallow;Penetration/Apiration after swallow Pharyngeal Material enters airway, passes BELOW cords without attempt by patient to eject out (silent aspiration) Pharyngeal- Mechanical Soft NT Pharyngeal -- Pharyngeal- Regular NT Pharyngeal -- Pharyngeal- Multi-consistency NT Pharyngeal -- Pharyngeal- Pill NT Pharyngeal -- Pharyngeal Comment --  CHL IP CERVICAL ESOPHAGEAL PHASE 09/22/2019 Cervical Esophageal Phase Impaired Pudding Teaspoon -- Pudding Cup -- Honey Teaspoon -- Honey Cup -- Nectar Teaspoon -- Nectar Cup -- Nectar Straw -- Thin Teaspoon -- Thin Cup Reduced cricopharyngeal relaxation Thin Straw -- Puree Reduced cricopharyngeal relaxation Mechanical Soft -- Regular -- Multi-consistency -- Pill -- Cervical Esophageal Comment -- Dannial Monarch 09/22/2019, 5:54 PM    Sonia Baller, MA, CCC-SLP Speech Therapy [facility] Acute Rehab Pager: [phone]          US Abdomen Limited Ruq  Result Date: 09/22/2019 CLINICAL DATA:  Ascites, evaluate for cirrhosis. EXAM: ULTRASOUND ABDOMEN LIMITED RIGHT UPPER QUADRANT COMPARISON:  None. FINDINGS: Gallbladder: No gallstones or wall thickening visualized. No sonographic Murphy sign noted by sonographer. Common bile duct: Diameter: 4 mm. Liver: Mild increased parenchymal echogenicity without focal mass. Portal vein is patent on color Doppler imaging with normal direction of blood flow towards the liver. Other: Scant amount of perihepatic fluid post paracentesis. IMPRESSION: No acute findings. Scant amount of perihepatic fluid post paracentesis. Mild hepatic steatosis without focal mass. Electronically Signed   By: Marin Olp M.D.   On: 09/22/2019 15:43    Scheduled Meds:  folic acid  1 mg Oral Daily   multivitamin with minerals  1 tablet Oral Daily   sodium chloride flush  3 mL Intravenous Once   thiamine  100 mg Oral Daily   Or   thiamine  100 mg Intravenous Daily   Continuous Infusions:  dextrose 5 % and 0.9% NaCl 100 mL/hr at 09/23/19 1145  piperacillin-tazobactam (ZOSYN)  IV 3.375 g (09/23/19 0825)     LOS: 2 days   Time spent: 74 minutes   Darliss Cheney, MD [facility]  09/23/2019, 11:59 AM   To contact the attending provider between 7A-7P or the covering provider during after hours 7P-7A, please log into the web site [email or web address] and use password [email or web address].

## 2019-09-24 ENCOUNTER — Encounter (HOSPITAL_COMMUNITY): Payer: Self-pay

## 2019-09-24 ENCOUNTER — Inpatient Hospital Stay (HOSPITAL_COMMUNITY): Payer: Medicaid Other

## 2019-09-24 DIAGNOSIS — F1010 Alcohol abuse, uncomplicated: Secondary | ICD-10-CM

## 2019-09-24 LAB — BASIC METABOLIC PANEL
Anion gap: 9 (ref 5–15)
BUN: 12 mg/dL (ref 8–23)
CO2: 24 mmol/L (ref 22–32)
Calcium: 8 mg/dL — ABNORMAL LOW (ref 8.9–10.3)
Chloride: 114 mmol/L — ABNORMAL HIGH (ref 98–111)
Creatinine, Ser: 1.73 mg/dL — ABNORMAL HIGH (ref 0.61–1.24)
GFR calc Af Amer: 48 mL/min — ABNORMAL LOW (ref >60)
GFR calc non Af Amer: 41 mL/min — ABNORMAL LOW (ref >60)
Glucose, Bld: 86 mg/dL (ref 70–99)
Potassium: 3.1 mmol/L — ABNORMAL LOW (ref 3.5–5.1)
Sodium: 147 mmol/L — ABNORMAL HIGH (ref 135–145)

## 2019-09-24 LAB — GLUCOSE, CAPILLARY
Glucose-Capillary: 95 mg/dL (ref 70–99)
Glucose-Capillary: 107 mg/dL — ABNORMAL HIGH (ref 70–99)
Glucose-Capillary: 77 mg/dL (ref 70–99)
Glucose-Capillary: 71 mg/dL (ref 70–99)
Glucose-Capillary: 50 mg/dL — ABNORMAL LOW (ref 70–99)
Glucose-Capillary: 58 mg/dL — ABNORMAL LOW (ref 70–99)
Glucose-Capillary: 121 mg/dL — ABNORMAL HIGH (ref 70–99)
Glucose-Capillary: 93 mg/dL (ref 70–99)

## 2019-09-24 LAB — CBC WITH DIFFERENTIAL/PLATELET
Abs Immature Granulocytes: 0.01 10*3/uL (ref 0.00–0.07)
Basophils Absolute: 0 10*3/uL (ref 0.0–0.1)
Basophils Relative: 0 %
Eosinophils Absolute: 0.1 10*3/uL (ref 0.0–0.5)
Eosinophils Relative: 3 %
HCT: 29.6 % — ABNORMAL LOW (ref 39.0–52.0)
Hemoglobin: 9.6 g/dL — ABNORMAL LOW (ref 13.0–17.0)
Immature Granulocytes: 0 %
Lymphocytes Relative: 15 %
Lymphs Abs: 0.4 10*3/uL — ABNORMAL LOW (ref 0.7–4.0)
MCH: 32.5 pg (ref 26.0–34.0)
MCHC: 32.4 g/dL (ref 30.0–36.0)
MCV: 100.3 fL — ABNORMAL HIGH (ref 80.0–100.0)
Monocytes Absolute: 0.4 10*3/uL (ref 0.1–1.0)
Monocytes Relative: 12 %
Neutro Abs: 2.1 10*3/uL (ref 1.7–7.7)
Neutrophils Relative %: 70 %
Platelets: 85 10*3/uL — ABNORMAL LOW (ref 150–400)
RBC: 2.95 MIL/uL — ABNORMAL LOW (ref 4.22–5.81)
RDW: 18 % — ABNORMAL HIGH (ref 11.5–15.5)
WBC: 3 10*3/uL — ABNORMAL LOW (ref 4.0–10.5)
nRBC: 0 % (ref 0.0–0.2)

## 2019-09-24 LAB — MAGNESIUM: Magnesium: 1.7 mg/dL (ref 1.7–2.4)

## 2019-09-24 LAB — LACTIC ACID, PLASMA
Lactic Acid, Venous: 1.3 mmol/L (ref 0.5–1.9)
Lactic Acid, Venous: 1.4 mmol/L (ref 0.5–1.9)

## 2019-09-24 LAB — CYTOLOGY - NON PAP

## 2019-09-24 MED ORDER — DEXTROSE 50 % IV SOLN
50.0000 mL | Freq: Once | INTRAVENOUS | Status: AC
  Administered 2019-09-24: 50 mL via INTRAVENOUS

## 2019-09-24 MED ORDER — MUPIROCIN 2 % EX OINT
1.0000 "application " | TOPICAL_OINTMENT | Freq: Two times a day (BID) | CUTANEOUS | Status: AC
  Administered 2019-09-24 – 2019-09-28 (×10): 1 via NASAL
  Filled 2019-09-24 (×2): qty 22

## 2019-09-24 MED ORDER — CHLORHEXIDINE GLUCONATE CLOTH 2 % EX PADS
6.0000 | MEDICATED_PAD | Freq: Every day | CUTANEOUS | Status: AC
  Administered 2019-09-24 – 2019-09-27 (×5): 6 via TOPICAL

## 2019-09-24 MED ORDER — POTASSIUM CHLORIDE 10 MEQ/100ML IV SOLN
INTRAVENOUS | Status: AC
  Administered 2019-09-25: 10 meq
  Filled 2019-09-24: qty 100

## 2019-09-24 MED ORDER — POTASSIUM CHLORIDE 10 MEQ/100ML IV SOLN
10.0000 meq | INTRAVENOUS | Status: AC
  Administered 2019-09-24 (×3): 10 meq via INTRAVENOUS
  Filled 2019-09-24 (×2): qty 100

## 2019-09-24 MED ORDER — POTASSIUM CHLORIDE 10 MEQ/100ML IV SOLN
INTRAVENOUS | Status: AC
  Administered 2019-09-24: 23:00:00
  Filled 2019-09-24: qty 100

## 2019-09-24 MED ORDER — DEXTROSE 50 % IV SOLN
INTRAVENOUS | Status: AC
  Administered 2019-09-24: 50 mL
  Filled 2019-09-24: qty 50

## 2019-09-24 MED ORDER — DEXTROSE 10 % IV SOLN
INTRAVENOUS | Status: DC
  Administered 2019-09-24 – 2019-09-26 (×2): via INTRAVENOUS

## 2019-09-24 NOTE — Progress Notes (Signed)
.    Vital Signs MEWS/VS Documentation      09/24/2019 0240 09/24/2019 0340 09/24/2019 0421 09/24/2019 0424   MEWS Score:  2  2  -  2   MEWS Score Color:  Yellow  Yellow  -  Yellow   Resp:  -  16  -  -   Pulse:  -  71  -  -   BP:  (!) 75/62  -  -  -   O2 Device:  Tracheostomy Collar  Tracheostomy Collar  Tracheostomy Collar  -   O2 Flow Rate (L/min):  -  5 L/min  5 L/min  -   FiO2 (%):  -  28 %  -  -   Level of Consciousness:  -  -  -  Alert      Patient has chronic hypotension, MD and medical team are aware of patients on going status.  Patient assessed and remains in stable condition.  Will continue to monitor.     Bethann Qualley 09/24/2019,4:54 AM

## 2019-09-24 NOTE — Progress Notes (Signed)
Rehab Admissions Coordinator Note:  Per OT recommendation, this patient was screened by Raechel Ache for appropriateness for an Inpatient Acute Rehab Consult.  At this time, we are recommending Inpatient Rehab consult. AC will contact MD to request order.   Raechel Ache 09/24/2019, 5:06 PM  I can be reached at [phone].

## 2019-09-24 NOTE — TOC Initial Note (Signed)
Transition of Care [facility]) - Initial/Assessment Note    Patient Details  Name: Jack Mcintyre MRN: [id number] Date of Birth: [birth year]  Transition of Care [facility]) CM/SW Contact:    Gelene Mink, [place] Phone Number: 09/24/2019, 4:16 PM  Clinical Narrative:                  CSW met with the patient and his mother at bedside. CSW shared the therapy recommendation. The patient's mother is agreeable to rehab. She told the patient that she cannot care for him at home with his current condition and she encouraged him to go. The patient shook his head yes. CSW explained the therapy process. CSW asked if they would like to use medicaid or VA benefits. Patient's mother asked the difference, CSW explained the difference. They chose to use medicaid benefits. CSW explained that there are only so many trach SNF's in the area. CSW obtained permission to fax the patient out and provide bed offers.   CSW will continue to follow and assist with disposition planning.    Expected Discharge Plan: Skilled Nursing Facility Barriers to Discharge: Continued Medical Work up   Patient Goals and CMS Choice Patient states their goals for this hospitalization and ongoing recovery are:: Pt mother wants him to get rehab, she cannot care for him at home CMS Medicare.gov Compare Post Acute Care list provided to:: Patient Represenative (must comment) Choice offered to / list presented to : Parent  Expected Discharge Plan and Services Expected Discharge Plan: [place] In-house Referral: Clinical Social Work Discharge Planning Services: NA Post Acute Care Choice: [place] Living arrangements for the past 2 months: Apartment                 DME Arranged: N/A DME Agency: NA       HH Arranged: NA [place] Agency: NA        Prior Living Arrangements/Services Living arrangements for the past 2 months: Apartment Lives with:: Parents Patient language and need for interpreter reviewed::  No Do you feel safe going back to the place where you live?: Yes      Need for Family Participation in Patient Care: Yes (Comment) Care giver support system in place?: Yes (comment) Current home services: DME Criminal Activity/Legal Involvement Pertinent to Current Situation/Hospitalization: No - Comment as needed  Activities of Daily Living Home Assistive Devices/Equipment: None ADL Screening (condition at time of admission) Patient's cognitive ability adequate to safely complete daily activities?: Yes Is the patient deaf or have difficulty hearing?: No Does the patient have difficulty seeing, even when wearing glasses/contacts?: No Does the patient have difficulty concentrating, remembering, or making decisions?: No Patient able to express need for assistance with ADLs?: Yes Does the patient have difficulty dressing or bathing?: No Independently performs ADLs?: Yes (appropriate for developmental age) Does the patient have difficulty walking or climbing stairs?: Yes Weakness of Legs: None Weakness of Arms/Hands: None  Permission Sought/Granted Permission sought to share information with : Case Manager Permission granted to share information with : Yes, Verbal Permission Granted  Share Information with NAME: Jack Mcintyre  Permission granted to share info w AGENCY: All SNF  Permission granted to share info w Relationship: Mother     Emotional Assessment Appearance:: Appears stated age Attitude/Demeanor/Rapport: Unable to Assess Affect (typically observed): Unable to Assess Orientation: : Oriented to Self, Oriented to Place, Oriented to  Time, Oriented to Situation Alcohol / Substance Use: Alcohol Use Psych Involvement: No (comment)  Admission diagnosis:  Dehydration [E86.0] Weakness [R53.1] Ascites [R18.8] Anemia, unspecified type [D64.9] Aspiration pneumonia of both lungs, unspecified aspiration pneumonia type, unspecified part of lung ([place]) [J69.0] Patient Active Problem List    Diagnosis Date Noted  . Hypokalemia 09/22/2019  . Hypoglycemia 09/22/2019  . Anemia 09/22/2019  . Lung nodule 09/22/2019  . Aspiration pneumonia ([place]) 09/21/2019  . Alcohol abuse 09/21/2019  . Malnutrition of moderate degree 05/17/2018  . Pneumonia 05/16/2018  . Acute kidney injury ([place]) 05/16/2018  . Hyponatremia 05/16/2018   PCP:  Clinic, [place]:   [place], [place] [phone] [place] [address] Phone: [phone] Fax: [phone]     Social Determinants of Health (SDOH) Interventions    Readmission Risk Interventions No flowsheet data found.

## 2019-09-24 NOTE — NC FL2 (Signed)
[name] LEVEL OF CARE SCREENING TOOL     IDENTIFICATION  Patient Name: Jack Mcintyre Birthdate: 1956/08/13 Sex: male Admission Date (Current Location): 09/21/2019  [place] and [place] Number:  Jack Mcintyre [id number] [place] and Address:  The [place]. [facility], [place] [address] [place], [place] [address]      Provider Number: [id number]  Attending Physician Name and Address:  Darliss Cheney, MD  Relative Name and Phone Number:  Jack Mcintyre, Mother, [phone]    Current Level of Care: Hospital Recommended Level of Care: [place] Prior Approval Number:    Date Approved/Denied:   PASRR Number: [id number]  Discharge Plan: SNF    Current Diagnoses: Patient Active Problem List   Diagnosis Date Noted  . Hypokalemia 09/22/2019  . Hypoglycemia 09/22/2019  . Anemia 09/22/2019  . Lung nodule 09/22/2019  . Aspiration pneumonia ([place]) 09/21/2019  . Alcohol abuse 09/21/2019  . Malnutrition of moderate degree 05/17/2018  . Pneumonia 05/16/2018  . Acute kidney injury ([place]) 05/16/2018  . Hyponatremia 05/16/2018    Orientation RESPIRATION BLADDER Height & Weight     Self, Time, Situation, Place  Tracheostomy(sp02 100, Fi02 28, flow rate 5, Has had since 2015) Continent Weight: 163 lb 9.3 oz (74.2 kg) Height:  6\' 1"  (185.4 cm)  BEHAVIORAL SYMPTOMS/MOOD NEUROLOGICAL BOWEL NUTRITION STATUS      Continent Diet(NPO)  AMBULATORY STATUS COMMUNICATION OF NEEDS Skin   Extensive Assist Verbally Normal                       Personal Care Assistance Level of Assistance  Bathing, Feeding, Dressing, Total care Bathing Assistance: Maximum assistance Feeding assistance: Limited assistance Dressing Assistance: Maximum assistance Total Care Assistance: Maximum assistance   Functional Limitations Info  Sight, Hearing, Speech Sight Info: Adequate Hearing Info: Adequate Speech Info: Impaired(Has trach, difficult to speak)    [place]  PT (By licensed PT), OT (By licensed OT)     PT Frequency: 5x/wk OT Frequency: 5x/wk            Contractures Contractures Info: Not present    Additional Factors Info  Code Status, Allergies Code Status Info: Full Code Allergies Info: No Known Allergies           Current Medications (09/24/2019):  This is the current hospital active medication list Current Facility-Administered Medications  Medication Dose Route Frequency Provider Last Rate Last Dose  . Chlorhexidine Gluconate Cloth 2 % PADS 6 each  6 each Topical Q0600 Darliss Cheney, MD   6 each at 09/24/19 1214  . dextrose 5 %-0.9 % sodium chloride infusion   Intravenous Continuous Darliss Cheney, MD 125 mL/hr at 09/24/19 1414    . folic acid (FOLVITE) tablet 1 mg  1 mg Oral Daily Doutova, Anastassia, MD      . LORazepam (ATIVAN) tablet 1-4 mg  1-4 mg Oral Q1H PRN Toy Baker, MD       Or  . LORazepam (ATIVAN) injection 1-4 mg  1-4 mg Intravenous Q1H PRN Toy Baker, MD   2 mg at 09/23/19 0233  . multivitamin with minerals tablet 1 tablet  1 tablet Oral Daily Doutova, Anastassia, MD      . mupirocin ointment (BACTROBAN) 2 % 1 application  1 application Nasal BID Darliss Cheney, MD   1 application at [id number]  . nicotine (NICODERM CQ - dosed in mg/24 hours) patch 14 mg  14 mg Transdermal Daily Darliss Cheney, MD  14 mg at 09/24/19 1115  . ondansetron (ZOFRAN) tablet 4 mg  4 mg Oral Q6H PRN Doutova, Anastassia, MD       Or  . ondansetron (ZOFRAN) injection 4 mg  4 mg Intravenous Q6H PRN Doutova, Anastassia, MD      . piperacillin-tazobactam (ZOSYN) IVPB 3.375 g  3.375 g Intravenous Q8H Doutova, Anastassia, MD 12.5 mL/hr at 09/24/19 1115 3.375 g at 09/24/19 1115  . potassium chloride 10 mEq in 100 mL IVPB  10 mEq Intravenous Q1 Hr x 5 Pahwani, Ravi, MD      . sodium chloride flush (NS) 0.9 % injection 3 mL  3 mL Intravenous Once Doutova, Anastassia, MD      . thiamine (VITAMIN B-1)  tablet 100 mg  100 mg Oral Daily Doutova, Anastassia, MD       Or  . thiamine (B-1) injection 100 mg  100 mg Intravenous Daily Doutova, Anastassia, MD   100 mg at 09/23/19 1022     Discharge Medications: Please see discharge summary for a list of discharge medications.  Relevant Imaging Results:  Relevant Lab Results:   Additional Information SSN: [id number]; Will be medicaid Bed; Has had trach since 2015  [place], LCSWA

## 2019-09-24 NOTE — Evaluation (Signed)
Occupational Therapy Evaluation Patient Details Name: Jack Mcintyre MRN: [id number] DOB: [birth year] Today's Date: 09/24/2019    History of Present Illness 63 y.o. male with medical history significant of throat cancer and radiation treatment status post trach and PEG this 2015, chronic cough,, history of tobacco and alcohol abuse, history of nocardia lung infection, CKD, anemia. Presented to ED 09/21/19 with progressive worsening has been having weakness altered mental status and aspiration recently admitted to [facility] and then continued to decline from there.  He has a chronic cough still productive of sputum continues to drink alcohol smokes half a pack a day. He had a PEG  in place but has been out 5-6 months ago it slipped out and never got replaced. He was tested negative for COVID-19.   Clinical Impression   This 63 yo male admitted with above presents to acute OT with decreased mobility, decreased balance, impulsivity, and generalized deconditioning thus affecting his PLOF of being totally independent with basic ADLs--currently he is Mod A- total A for basic ADLs. He will benefit from acute OT with follow up on CIR.    Follow Up Recommendations  CIR;Supervision/Assistance - 24 hour    Equipment Recommendations  Other (comment)(TBD next venue)       Precautions / Restrictions Precautions Precautions: Fall Precaution Comments: has decannulated himself when agitated Restrictions Weight Bearing Restrictions: No      Mobility Bed Mobility Overal bed mobility: Needs Assistance Bed Mobility: Supine to Sit;Sit to Supine     Supine to sit: Min assist;HOB elevated Sit to supine: Min assist      Transfers Overall transfer level: Needs assistance   Transfers: Sit to/from Stand Sit to Stand: Mod assist(partial sit<>stand to scoot up towards HOB)              Balance Overall balance assessment: Needs assistance Sitting-balance support: Feet supported;Bilateral upper extremity  supported Sitting balance-Leahy Scale: Poor                                     ADL either performed or assessed with clinical judgement   ADL Overall ADL's : Needs assistance/impaired Eating/Feeding: NPO   Grooming: Minimal assistance;Sitting   Upper Body Bathing: Minimal assistance;Sitting   Lower Body Bathing: Maximal assistance Lower Body Bathing Details (indicate cue type and reason): Mod A partial sit<>stand Upper Body Dressing : Moderate assistance;Sitting   Lower Body Dressing: Total assistance Lower Body Dressing Details (indicate cue type and reason): Mod A partial sit<>stand                     Vision Patient Visual Report: No change from baseline              Pertinent Vitals/Pain Pain Assessment: No/denies pain     Hand Dominance Right   Extremity/Trunk Assessment Upper Extremity Assessment Upper Extremity Assessment: Overall WFL for tasks assessed           Communication Communication Communication: Tracheostomy   Cognition Arousal/Alertness: Awake/alert Behavior During Therapy: Impulsive                                   General Comments: If you say we are going to do this, he thinks now--so does much better to get everything ready (lines/tubes) before telling him the plan  Home Living Family/patient expects to be discharged to:: Skilled nursing facility Living Arrangements: Parent(mother)                                      Prior Functioning/Environment          Comments: Mom reports that ever since discharged from [place] he has not been able to take care of himself; prior to his he could do his own basic ADLs.        OT Problem List: Decreased strength;Impaired balance (sitting and/or standing);Decreased safety awareness      OT Treatment/Interventions: Self-care/ADL training;DME and/or AE instruction;Patient/family education;Balance training;Therapeutic  exercise;Therapeutic activities    OT Goals(Current goals can be found in the care plan section) Acute Rehab OT Goals Patient Stated Goal: mom and patient to get stronger OT Goal Formulation: With patient/family Time For Goal Achievement: 10/08/19 Potential to Achieve Goals: Good  OT Frequency: Min 2X/week              AM-PAC OT "6 Clicks" Daily Activity     Outcome Measure Help from another person eating meals?: None(pt NPO) Help from another person taking care of personal grooming?: A Little Help from another person toileting, which includes using toliet, bedpan, or urinal?: A Lot Help from another person bathing (including washing, rinsing, drying)?: A Lot Help from another person to put on and taking off regular upper body clothing?: A Lot Help from another person to put on and taking off regular lower body clothing?: A Lot 6 Click Score: 15   End of Session Equipment Utilized During Treatment: Gait belt  Activity Tolerance: Patient tolerated treatment well Patient left: in bed;with call bell/phone within reach;with bed alarm set;with family/visitor present(mother)  OT Visit Diagnosis: Other abnormalities of gait and mobility (R26.89);Muscle weakness (generalized) (M62.81)                Time: [id number] OT Time Calculation (min): 28 min Charges:  OT General Charges $OT Visit: 1 Visit OT Evaluation $OT Eval Moderate Complexity: 1 Mod OT Treatments $Self Care/Home Management : 8-22 mins  [place], OTR/L Acute [employer] Pager [phone] Office [phone]     Almon Register 09/24/2019, 4:39 PM

## 2019-09-24 NOTE — Progress Notes (Signed)
Physical Therapy Treatment Patient Details Name: Jack Mcintyre MRN: [id number] DOB: March 02, 1956 Today's Date: 09/24/2019    History of Present Illness 63 y.o. male with medical history significant of throat cancer and radiation treatment status post trach and PEG this 2015, chronic cough,, history of tobacco and alcohol abuse, history of nocardia lung infection, CKD, anemia. Presented to ED 09/21/19 with progressive worsening has been having weakness altered mental status and aspiration recently admitted to [facility] and then continued to decline from there.  He has a chronic cough still productive of sputum continues to drink alcohol smokes half a pack a day. He had a PEG  in place but has been out 5-6 months ago it slipped out and never got replaced. He was tested negative for COVID-19.    PT Comments    Pt is up to side of bed and taking steps with PT and with his mother in attendance.  Asks for food and reminded him several times he could not have it.  Pt is able to assist with all movement, mostly weak to power up to stand but with walker can control his balance better.  Follow acutely for this weakness and control of standing power and endurance, and progress gait as he is safely able.   Follow Up Recommendations  Supervision/Assistance - 24 hour;CIR     Equipment Recommendations  Other (comment)    Recommendations for Other Services OT consult     Precautions / Restrictions Precautions Precautions: Fall Precaution Comments: has decannulated himself when agitated Restrictions Weight Bearing Restrictions: No Other Position/Activity Restrictions: monitor when pulling up in bed    Mobility  Bed Mobility Overal bed mobility: Needs Assistance Bed Mobility: Supine to Sit;Sit to Supine Rolling: Min assist   Supine to sit: Mod assist Sit to supine: Mod assist      Transfers Overall transfer level: Needs assistance Equipment used: Rolling walker (2 wheeled);1 person hand held  assist Transfers: Sit to/from Stand Sit to Stand: Mod assist         General transfer comment: pt bracing posterior calves against bed frame due to posterior bias;  Ambulation/Gait Ambulation/Gait assistance: Min assist Gait Distance (Feet): 10 Feet Assistive device: Rolling walker (2 wheeled);1 person hand held assist Gait Pattern/deviations: Step-to pattern;Decreased stride length;Trunk flexed;Wide base of support Gait velocity: reduced Gait velocity interpretation: <1.8 ft/sec, indicate of risk for recurrent falls General Gait Details: sidestepping side of bed with controllled O2 sats   Stairs             Wheelchair Mobility    Modified Rankin (Stroke Patients Only)       Balance Overall balance assessment: Needs assistance Sitting-balance support: Feet supported;Bilateral upper extremity supported Sitting balance-Leahy Scale: Fair   Postural control: Posterior lean Standing balance support: Bilateral upper extremity supported;During functional activity Standing balance-Leahy Scale: Poor                              Cognition Arousal/Alertness: Awake/alert Behavior During Therapy: Impulsive Overall Cognitive Status: Impaired/Different from baseline Area of Impairment: Following commands;Awareness;Safety/judgement;Problem solving                       Following Commands: Follows one step commands inconsistently;Follows one step commands with increased time Safety/Judgement: Decreased awareness of safety;Decreased awareness of deficits Awareness: Intellectual Problem Solving: Slow processing;Difficulty sequencing;Requires verbal cues;Requires tactile cues General Comments: pt is standing up when asked to wait with mulr  repetitions      Exercises      General Comments General comments (skin integrity, edema, etc.): pt is able to stand with PT from side of bed and with dense cues can sidestep      Pertinent Vitals/Pain Pain  Assessment: No/denies pain    Home Living Family/patient expects to be discharged to:: Skilled nursing facility Living Arrangements: Parent(mother)                  Prior Function        Comments: Mom reports that ever since discharged from [place] he has not been able to take care of himself; prior to his he could do his own basic ADLs.   PT Goals (current goals can now be found in the care plan section) Acute Rehab PT Goals Patient Stated Goal: mom and patient to get stronger Progress towards PT goals: Progressing toward goals    Frequency    Min 3X/week      PT Plan Discharge plan needs to be updated    Co-evaluation              AM-PAC PT "6 Clicks" Mobility   Outcome Measure  Help needed turning from your back to your side while in a flat bed without using bedrails?: A Little Help needed moving from lying on your back to sitting on the side of a flat bed without using bedrails?: A Lot Help needed moving to and from a bed to a chair (including a wheelchair)?: A Lot Help needed standing up from a chair using your arms (e.g., wheelchair or bedside chair)?: A Lot Help needed to walk in hospital room?: A Little Help needed climbing 3-5 steps with a railing? : Total 6 Click Score: 13    End of Session Equipment Utilized During Treatment: Gait belt;Oxygen Activity Tolerance: Patient tolerated treatment well;Patient limited by fatigue;Treatment limited secondary to medical complications (Comment) Patient left: in bed;with call bell/phone within reach;with bed alarm set;with family/visitor present Nurse Communication: Mobility status PT Visit Diagnosis: Muscle weakness (generalized) (M62.81);Unsteadiness on feet (R26.81)     Time: [id number] PT Time Calculation (min) (ACUTE ONLY): 35 min  Charges:  $Gait Training: 8-22 mins $Therapeutic Activity: 8-22 mins                   Ramond Dial 09/24/2019, 5:51 PM   Mee Hives, PT MS Acute Rehab Dept. Number: [place] and [place]

## 2019-09-24 NOTE — Progress Notes (Signed)
PROGRESS NOTE    Jack Mcintyre  [id number] DOB: [birth year] DOA: 09/21/2019 PCP: [facility]   Brief Narrative:  Jack Mcintyre is a 63 y.o. male with medical history significant of throat cancer and radiation treatment status post trach and PEG in 2015, chronic cough,, history of tobacco and alcohol abuse, history of lung infection, CKD stage III, chronic anemia was brought into the ED on 09/21/2019 by his wife with a concern of decline in his health, weakness, confusion and some shortness of breath.  Of note, he was recently admitted at the [facility] facility from September 28 through October 1.  Reportedly, he is also alcoholic and has been drinking 1 pint of liquor almost daily.  Upon arrival to emergency department, he was slightly hypotensive.  Potassium was 3.2 and hypoglycemic with blood glucose of 66.  He failed bedside swallow evaluation in the emergency department and was subsequently diagnosed with aspiration pneumonia bilateral based on the chest x-ray and CT scan of the lungs.  He was started on broad-spectrum antibiotics and then admitted under [facility] service.  He was tested negative for COVID-19.  Antibiotics for pneumonia were continued.  He was observed to have significant aspiration and gurgling sound in his throat.  SLP was consulted.  MBS was done and he was deemed to have moderate aspiration risk and they recommended alternate means of nutrition.  He refused for PEG tube yesterday however he agreed for it today.  IR consulted.  Assessment & Plan:   Active Problems:   Acute kidney injury (HCC)   Malnutrition of moderate degree   Aspiration pneumonia (HCC)   Alcohol abuse   Hypokalemia   Hypoglycemia   Anemia   Lung nodule  Recurrent aspiration pneumonia/dysphagia: Evaluated by SLP.  MBS done.  Has moderate aspiration risk.  SLP recommends alternative routes for nutrition such as PEG tube placement.  Patient is agreeable with PEG tube placement.  IR was consulted on  09/23/2019..  I tried to call his mother, Ms. Philipp with no answer.  She has not returned my call either. Continue current IV antibiotics which is Zosyn.  Cultures negative so far.  Will place NG tube for alternative means of nutrition in the meantime.  Hypokalemia: 3.1 again today.  Will replace.  CKD stage III: Seems to have baseline creatinine of about 1.7.  Currently at baseline.  Continue to watch.  Hypoglycemia: Blood sugar fairly controlled and within normal range with dextrose 10% in normal saline running 800 cc/h.  Alcohol abuse: Drinks about 1 pint of liquor every day.  High risk for withdrawal.  Continue CIWA with multivitamin and PRN Ativan.  No signs of withdrawal so far.  Chronic anemia: Seems to have chronic anemia.  We only have to the lab records from May 2019 through now.  Highest hemoglobin has been 10.3.  Yesterday upon admission he was 7.2.  It is hard to find a baseline for him.  He received 1 unit of PRBC transfusion on 09/21/2019.  Hemoglobin has remained stable since then.  Hypotension/dehydration: Blood pressure continues to remain low.  Will increase IV fluids to 125 cc/h.  Monitor closely.  Will check lactic acid.  Lung nodule: This is known.  He follows up with pulmonology as an outpatient.  Anasarca: Due to poor p.o. intake and hypoalbuminemia.  Ascites: Status post 2 L of paracentesis on 09/22/2019.  Fluid so far looks aseptic.  Culture negative so far.  No abdominal pain or tenderness on exam.  DVT  prophylaxis: SCD.  Avoiding heparin due to anemia. Code Status: Full code Family Communication:  None present at bedside.  I called and left voicemail to his mom yesterday.  I will call her again today. Disposition Plan: TBD  Consultants:   None  Procedures:   None  Antimicrobials:   IV Zosyn 09/21/2019   Subjective: Patient seen and examined.  He is slightly more alert today.  Oriented x3.  He has no complaint.  He was asking if he will be allowed to  eat.  Objective: Vitals:   09/24/19 0340 09/24/19 0817 09/24/19 1124 09/24/19 1130  BP:  (!) 81/60 (!) 80/57 (!) 79/61  Pulse: 71 67 75 75  Resp: 16 15 16 19   Temp:    98.2 F (36.8 C)  TempSrc:    Oral  SpO2: 96% 100% 99% 100%  Weight:      Height:        Intake/Output Summary (Last 24 hours) at 09/24/2019 1307 Last data filed at 09/24/2019 0400 Gross per 24 hour  Intake 1874.39 ml  Output 370 ml  Net 1504.39 ml   Filed Weights   09/21/19 2007 09/22/19 0100  Weight: 68 kg 74.2 kg    Examination:  General exam: Appears calm and comfortable  Respiratory system: Rhonchi and coarse breath sounds bilaterally. Respiratory effort normal.  Trach in place. Cardiovascular system: S1 & S2 heard, RRR. No JVD, murmurs, rubs, gallops or clicks. No pedal edema. Gastrointestinal system: Abdomen is nondistended, soft and nontender. No organomegaly or masses felt. Normal bowel sounds heard. Central nervous system: Alert and oriented x3. No focal neurological deficits. Extremities: Symmetric 5 x 5 power. Skin: No rashes, lesions or ulcers.  Psychiatry: Judgement and insight appear poor. Mood & affect flat.   Data Reviewed: I have personally reviewed following labs and imaging studies  CBC: Recent Labs  Lab 09/21/19 1759 09/22/19 1129 09/23/19 1359 09/24/19 1041  WBC 2.7* 4.7 3.3* 3.0*  NEUTROABS  --  3.6 2.4 2.1  HGB 7.2* 9.9* 10.4* 9.6*  HCT 21.5* 29.0* 31.2* 29.6*  MCV 103.4* 97.6 99.4 100.3*  PLT 105* 109* 96* 85*   Basic Metabolic Panel: Recent Labs  Lab 09/21/19 1759 09/21/19 1922 09/22/19 1129 09/23/19 1359 09/24/19 1041  NA 144  --  145 146* 147*  K 3.2*  --  3.2* 3.2* 3.1*  CL 107  --  109 114* 114*  CO2 27  --  27 24 24   GLUCOSE 74  --  92 84 86  BUN 13  --  12 14 12   CREATININE 1.67*  --  1.72* 1.65* 1.73*  CALCIUM 8.9  --  8.7* 8.3* 8.0*  MG  --  1.7 1.8  --  1.7   GFR: Estimated Creatinine Clearance: 46.5 mL/min (A) (by C-G formula based on SCr of  1.73 mg/dL (H)). Liver Function Tests: Recent Labs  Lab 09/21/19 1759 09/22/19 1129 09/23/19 1359  AST 39 31 22  ALT 13 12 12   ALKPHOS 66 55 47  BILITOT 0.8 0.6 0.8  PROT 6.4* 6.4* 5.8*  ALBUMIN 2.5* 2.5* 2.0*   Recent Labs  Lab 09/21/19 2045  LIPASE 36   Recent Labs  Lab 09/21/19 2040  AMMONIA 17   Coagulation Profile: Recent Labs  Lab 09/21/19 1939  INR 1.1   Cardiac Enzymes: No results for input(s): CKTOTAL, CKMB, CKMBINDEX, TROPONINI in the last 168 hours. BNP (last 3 results) No results for input(s): PROBNP in the last 8760 hours. HbA1C: No results  for input(s): HGBA1C in the last 72 hours. CBG: Recent Labs  Lab 09/23/19 1917 09/23/19 2309 09/24/19 0240 09/24/19 0744 09/24/19 1132  GLUCAP 78 103* 95 107* 77   Lipid Profile: No results for input(s): CHOL, HDL, LDLCALC, TRIG, CHOLHDL, LDLDIRECT in the last 72 hours. Thyroid Function Tests: Recent Labs    09/22/19 1129  TSH 121.742*   Anemia Panel: Recent Labs    09/22/19 1129  VITAMINB12 1,614*  FOLATE 6.3  FERRITIN 220  TIBC 158*  IRON 47  RETICCTPCT 1.7   Sepsis Labs: Recent Labs  Lab 09/21/19 1922 09/21/19 2040  LATICACIDVEN 1.1 0.9    Recent Results (from the past 240 hour(s))  SARS Coronavirus 2 [facility] order, Performed in [facility] hospital lab) Nasopharyngeal Nasopharyngeal Swab     Status: None   Collection Time: 09/21/19  8:10 PM   Specimen: Nasopharyngeal Swab  Result Value Ref Range Status   SARS Coronavirus 2 NEGATIVE NEGATIVE Final    Comment: (NOTE) If result is NEGATIVE SARS-CoV-2 target nucleic acids are NOT DETECTED. The SARS-CoV-2 RNA is generally detectable in upper and lower  respiratory specimens during the acute phase of infection. The lowest  concentration of SARS-CoV-2 viral copies this assay can detect is 250  copies / mL. A negative result does not preclude SARS-CoV-2 infection  and should not be used as the sole basis for treatment or other   patient management decisions.  A negative result may occur with  improper specimen collection / handling, submission of specimen other  than nasopharyngeal swab, presence of viral mutation(s) within the  areas targeted by this assay, and inadequate number of viral copies  (<250 copies / mL). A negative result must be combined with clinical  observations, patient history, and epidemiological information. If result is POSITIVE SARS-CoV-2 target nucleic acids are DETECTED. The SARS-CoV-2 RNA is generally detectable in upper and lower  respiratory specimens dur ing the acute phase of infection.  Positive  results are indicative of active infection with SARS-CoV-2.  Clinical  correlation with patient history and other diagnostic information is  necessary to determine patient infection status.  Positive results do  not rule out bacterial infection or co-infection with other viruses. If result is PRESUMPTIVE POSTIVE SARS-CoV-2 nucleic acids MAY BE PRESENT.   A presumptive positive result was obtained on the submitted specimen  and confirmed on repeat testing.  While 2019 novel coronavirus  (SARS-CoV-2) nucleic acids may be present in the submitted sample  additional confirmatory testing may be necessary for epidemiological  and / or clinical management purposes  to differentiate between  SARS-CoV-2 and other Sarbecovirus currently known to infect humans.  If clinically indicated additional testing with an alternate test  methodology [phone]) is advised. The SARS-CoV-2 RNA is generally  detectable in upper and lower respiratory sp ecimens during the acute  phase of infection. The expected result is Negative. Fact Sheet for Patients:  StrictlyIdeas.no Fact Sheet for Healthcare Providers: BankingDealers.co.za This test is not yet approved or cleared by the [place] FDA and has been authorized for detection and/or diagnosis of SARS-CoV-2  by FDA under an Emergency Use Authorization (EUA).  This EUA will remain in effect (meaning this test can be used) for the duration of the COVID-19 declaration under Section 564(b)(1) of the Act, 21 U.S.C. section 360bbb-3(b)(1), unless the authorization is terminated or revoked sooner. Performed at [facility], [place] [address] [place], [place] [address]   MRSA PCR Screening     Status:  Abnormal   Collection Time: 09/22/19  1:55 AM   Specimen: Nasal Mucosa; Nasopharyngeal  Result Value Ref Range Status   MRSA by PCR POSITIVE (A) NEGATIVE Final    Comment:        The GeneXpert MRSA Assay (FDA approved for NASAL specimens only), is one component of a comprehensive MRSA colonization surveillance program. It is not intended to diagnose MRSA infection nor to guide or monitor treatment for MRSA infections. RESULT CALLED TO, READ BACK BY AND VERIFIED WITHPricilla Larsson RN 09/22/19 0420 JDW Performed at [place] [address], [place], [place] [address]   Culture, blood (routine x 2)     Status: None (Preliminary result)   Collection Time: 09/22/19 11:58 AM   Specimen: BLOOD RIGHT ARM  Result Value Ref Range Status   Specimen Description BLOOD RIGHT ARM  Final   Special Requests   Final    BOTTLES DRAWN AEROBIC AND ANAEROBIC Blood Culture adequate volume   Culture   Final    NO GROWTH 2 DAYS Performed at [facility], [place] [address], [place], [place] [address]    Report Status PENDING  Incomplete  Culture, blood (routine x 2)     Status: None (Preliminary result)   Collection Time: 09/22/19 11:58 AM   Specimen: BLOOD RIGHT ARM  Result Value Ref Range Status   Specimen Description BLOOD RIGHT ARM  Final   Special Requests   Final    BOTTLES DRAWN AEROBIC AND ANAEROBIC Blood Culture adequate volume   Culture   Final    NO GROWTH 2 DAYS Performed at [facility], [place] [address], [place], [place] [address]    Report Status PENDING  Incomplete   Respiratory Panel by PCR     Status: None   Collection Time: 09/22/19 12:33 PM   Specimen: Nasopharyngeal Swab; Respiratory  Result Value Ref Range Status   Adenovirus NOT DETECTED NOT DETECTED Final   Coronavirus 229E NOT DETECTED NOT DETECTED Final    Comment: (NOTE) The Coronavirus on the Respiratory Panel, DOES NOT test for the novel  Coronavirus (2019 nCoV)    Coronavirus HKU1 NOT DETECTED NOT DETECTED Final   Coronavirus NL63 NOT DETECTED NOT DETECTED Final   Coronavirus OC43 NOT DETECTED NOT DETECTED Final   Metapneumovirus NOT DETECTED NOT DETECTED Final   Rhinovirus / Enterovirus NOT DETECTED NOT DETECTED Final   Influenza A NOT DETECTED NOT DETECTED Final   Influenza B NOT DETECTED NOT DETECTED Final   Parainfluenza Virus 1 NOT DETECTED NOT DETECTED Final   Parainfluenza Virus 2 NOT DETECTED NOT DETECTED Final   Parainfluenza Virus 3 NOT DETECTED NOT DETECTED Final   Parainfluenza Virus 4 NOT DETECTED NOT DETECTED Final   Respiratory Syncytial Virus NOT DETECTED NOT DETECTED Final   Bordetella pertussis NOT DETECTED NOT DETECTED Final   Chlamydophila pneumoniae NOT DETECTED NOT DETECTED Final   Mycoplasma pneumoniae NOT DETECTED NOT DETECTED Final    Comment: Performed at [facility], [place]. [address], [place], [place] [address]  Gram stain     Status: None   Collection Time: 09/22/19  3:35 PM   Specimen: PATH Cytology Peritoneal fluid  Result Value Ref Range Status   Specimen Description PERITONEAL  Final   Special Requests NONE  Final   Gram Stain   Final    NO WBC SEEN NO ORGANISMS SEEN Performed at [facility], [address] [place], [place] [address]  Report Status 09/22/2019 FINAL  Final  Culture, body fluid-bottle     Status: None (Preliminary result)   Collection Time: 09/22/19  3:35 PM   Specimen: Peritoneal Washings  Result Value Ref Range Status   Specimen Description PERITONEAL  Final   Special Requests NONE  Final   Culture   Final     NO GROWTH 2 DAYS Performed at [facility], [address], [place], [place] [address]    Report Status PENDING  Incomplete      Radiology Studies: US Paracentesis  Result Date: 09/23/2019 INDICATION: Patient with history of throat cancer, aspiration pneumonia, chronic kidney disease, alcohol/tobacco abuse, ascites. Request made for diagnostic and therapeutic paracentesis. EXAM: ULTRASOUND GUIDED DIAGNOSTIC AND THERAPEUTIC PARACENTESIS MEDICATIONS: None COMPLICATIONS: None immediate. PROCEDURE: Informed written consent was obtained from the patient after a discussion of the risks, benefits and alternatives to treatment. A timeout was performed prior to the initiation of the procedure. Initial ultrasound scanning demonstrates a small to moderate amount of ascites within the right upper to mid abdominal quadrant. The right upper to mid abdomen was prepped and draped in the usual sterile fashion. 1% lidocaine was used for local anesthesia. Following this, a 19 gauge, 7-cm, Yueh catheter was introduced. An ultrasound image was saved for documentation purposes. The paracentesis was performed. The catheter was removed and a dressing was applied. The patient tolerated the procedure well without immediate post procedural complication. FINDINGS: A total of approximately 2.2 liters of yellow fluid was removed. Samples were sent to the laboratory as requested by the clinical team. IMPRESSION: Successful ultrasound-guided diagnostic and therapeutic paracentesis yielding 2.2 liters of peritoneal fluid. Read by: Rowe Robert, PA-C Electronically Signed   By: Corrie Mckusick D.O.   On: 09/22/2019 15:55   Dg Swallowing Func-speech Pathology  Result Date: 09/22/2019 Objective Swallowing Evaluation: Type of Study: MBS-Modified Barium Swallow Study  Patient Details Name: BRAIDAN EDWARDSON MRN: [id number] Date of Birth: [birth year] Today's Date: 09/22/2019 Time: SLP Start Time (ACUTE ONLY): 1555 -SLP Stop Time (ACUTE  ONLY): 1615 SLP Time Calculation (min) (ACUTE ONLY): 20 min Past Medical History: Past Medical History: Diagnosis Date  Cancer [facility])  Past Surgical History: No past surgical history on file. HPI: Patient is a 63 y.o. male with PMH: throat cancer and radiation treatment s/p trach and PEG in 2015,  chronic cough, h/o tobacco and alcohol abuse, h/o lung infection, CKD stage III, chronic anemia, who was brought to ED on 10/2 with decline in health, weakness, confusion and some SOB. Patient was recent admit to [place] from 9/28 to 10/1. CXR revealed aspiration PNA bilaterally; Covid 19 test negative. Patient's PEG tube "fell out" 5-weeks and has not been replaced.  Subjective: pleasant, did not seem to be the best historian Assessment / Plan / Recommendation CHL IP CLINICAL IMPRESSIONS 09/22/2019 Clinical Impression Patient presents with a severe pharyngeal dysphagia which is both sensory and motor based with significant impact from anatomy s/p chemoradiation in 2015.  Cuffless shiley #6 trach in place with PMV in place during assessment. Anatomy was difficult to fully assess and SLP could not visualize an epiglottis. All tested boluses of puree solids and thin liquids resulted in moderate to severe aspiration during and after the swallow, with boluses branching off with approximately 60% transiting through esophagus and 40% transiting into trachea. Residuals were observed at level of what would be vallecular sinus, and patient had to effortfully swallow for residiuals to transit, and again, aspiration occured. Aspiration was silent, however  patient did start to reflexively cought when large amount of aspirated had transited past his vocal cords. He was able to clear minimal amount of aspirate with cued cough. He is chronically aspirating and unfortunately, no indication that improvement could be made in his swallow function.  SLP Visit Diagnosis Dysphagia, pharyngeal phase (R13.13) Attention and concentration deficit  following -- Frontal lobe and executive function deficit following -- Impact on safety and function Severe aspiration risk;Risk for inadequate nutrition/hydration   CHL IP TREATMENT RECOMMENDATION 09/22/2019 Treatment Recommendations Therapy as outlined in treatment plan below   Prognosis 09/22/2019 Prognosis for Safe Diet Advancement Guarded Barriers to Reach Goals -- Barriers/Prognosis Comment -- CHL IP DIET RECOMMENDATION 09/22/2019 SLP Diet Recommendations NPO Liquid Administration via -- Medication Administration Via alternative means Compensations -- Postural Changes --   CHL IP OTHER RECOMMENDATIONS 09/22/2019 Recommended Consults -- Oral Care Recommendations Oral care QID Other Recommendations --   CHL IP FOLLOW UP RECOMMENDATIONS 09/22/2019 Follow up Recommendations None   CHL IP FREQUENCY AND DURATION 09/22/2019 Speech Therapy Frequency (ACUTE ONLY) min 1 x/week Treatment Duration 1 week      CHL IP ORAL PHASE 09/22/2019 Oral Phase WFL Oral - Pudding Teaspoon -- Oral - Pudding Cup -- Oral - Honey Teaspoon -- Oral - Honey Cup -- Oral - Nectar Teaspoon -- Oral - Nectar Cup -- Oral - Nectar Straw -- Oral - Thin Teaspoon -- Oral - Thin Cup -- Oral - Thin Straw -- Oral - Puree -- Oral - Mech Soft -- Oral - Regular -- Oral - Multi-Consistency -- Oral - Pill -- Oral Phase - Comment --  CHL IP PHARYNGEAL PHASE 09/22/2019 Pharyngeal Phase Impaired Pharyngeal- Pudding Teaspoon -- Pharyngeal -- Pharyngeal- Pudding Cup -- Pharyngeal -- Pharyngeal- Honey Teaspoon -- Pharyngeal -- Pharyngeal- Honey Cup -- Pharyngeal -- Pharyngeal- Nectar Teaspoon -- Pharyngeal -- Pharyngeal- Nectar Cup -- Pharyngeal -- Pharyngeal- Nectar Straw -- Pharyngeal -- Pharyngeal- Thin Teaspoon -- Pharyngeal -- Pharyngeal- Thin Cup Delayed swallow initiation-vallecula;Reduced pharyngeal peristalsis;Reduced airway/laryngeal closure;Penetration/Aspiration during swallow;Penetration/Apiration after swallow;Moderate aspiration;Significant aspiration  (Amount);Pharyngeal residue - valleculae Pharyngeal Material enters airway, passes BELOW cords without attempt by patient to eject out (silent aspiration) Pharyngeal- Thin Straw Delayed swallow initiation-vallecula;Reduced airway/laryngeal closure;Penetration/Aspiration during swallow;Penetration/Apiration after swallow;Significant aspiration (Amount);Moderate aspiration Pharyngeal Material enters airway, passes BELOW cords without attempt by patient to eject out (silent aspiration) Pharyngeal- Puree Delayed swallow initiation-vallecula;Pharyngeal residue - valleculae;Reduced airway/laryngeal closure;Penetration/Aspiration during swallow;Penetration/Apiration after swallow Pharyngeal Material enters airway, passes BELOW cords without attempt by patient to eject out (silent aspiration) Pharyngeal- Mechanical Soft NT Pharyngeal -- Pharyngeal- Regular NT Pharyngeal -- Pharyngeal- Multi-consistency NT Pharyngeal -- Pharyngeal- Pill NT Pharyngeal -- Pharyngeal Comment --  CHL IP CERVICAL ESOPHAGEAL PHASE 09/22/2019 Cervical Esophageal Phase Impaired Pudding Teaspoon -- Pudding Cup -- Honey Teaspoon -- Honey Cup -- Nectar Teaspoon -- Nectar Cup -- Nectar Straw -- Thin Teaspoon -- Thin Cup Reduced cricopharyngeal relaxation Thin Straw -- Puree Reduced cricopharyngeal relaxation Mechanical Soft -- Regular -- Multi-consistency -- Pill -- Cervical Esophageal Comment -- Dannial Monarch 09/22/2019, 5:54 PM    Sonia Baller, MA, CCC-SLP Speech Therapy [facility] Acute Rehab Pager: [phone]          US Abdomen Limited Ruq  Result Date: 09/22/2019 CLINICAL DATA:  Ascites, evaluate for cirrhosis. EXAM: ULTRASOUND ABDOMEN LIMITED RIGHT UPPER QUADRANT COMPARISON:  None. FINDINGS: Gallbladder: No gallstones or wall thickening visualized. No sonographic Murphy sign noted by sonographer. Common bile duct: Diameter: 4 mm. Liver: Mild increased parenchymal echogenicity without focal mass.  Portal vein is patent on color Doppler imaging  with normal direction of blood flow towards the liver. Other: Scant amount of perihepatic fluid post paracentesis. IMPRESSION: No acute findings. Scant amount of perihepatic fluid post paracentesis. Mild hepatic steatosis without focal mass. Electronically Signed   By: Marin Olp M.D.   On: 09/22/2019 15:43    Scheduled Meds:  Chlorhexidine Gluconate Cloth  6 each Topical 99991111   folic acid  1 mg Oral Daily   multivitamin with minerals  1 tablet Oral Daily   mupirocin ointment  1 application Nasal BID   nicotine  14 mg Transdermal Daily   sodium chloride flush  3 mL Intravenous Once   thiamine  100 mg Oral Daily   Or   thiamine  100 mg Intravenous Daily   Continuous Infusions:  dextrose 5 % and 0.9% NaCl 100 mL/hr at 09/24/19 0612   piperacillin-tazobactam (ZOSYN)  IV 3.375 g (09/24/19 1115)     LOS: 3 days   Time spent: 31 minutes   Darliss Cheney, MD [facility]  09/24/2019, 1:07 PM   To contact the attending provider between 7A-7P or the covering provider during after hours 7P-7A, please log into the web site [email or web address] and use password [email or web address].

## 2019-09-25 ENCOUNTER — Inpatient Hospital Stay (HOSPITAL_COMMUNITY): Payer: Medicaid Other

## 2019-09-25 DIAGNOSIS — D649 Anemia, unspecified: Secondary | ICD-10-CM

## 2019-09-25 LAB — CBC WITH DIFFERENTIAL/PLATELET
Abs Immature Granulocytes: 0.13 10*3/uL — ABNORMAL HIGH (ref 0.00–0.07)
Basophils Absolute: 0.1 10*3/uL (ref 0.0–0.1)
Basophils Relative: 1 %
Eosinophils Absolute: 0.1 10*3/uL (ref 0.0–0.5)
Eosinophils Relative: 3 %
HCT: 30 % — ABNORMAL LOW (ref 39.0–52.0)
Hemoglobin: 10.4 g/dL — ABNORMAL LOW (ref 13.0–17.0)
Immature Granulocytes: 4 %
Lymphocytes Relative: 16 %
Lymphs Abs: 0.6 10*3/uL — ABNORMAL LOW (ref 0.7–4.0)
MCH: 33.3 pg (ref 26.0–34.0)
MCHC: 34.7 g/dL (ref 30.0–36.0)
MCV: 96.2 fL (ref 80.0–100.0)
Monocytes Absolute: 0.4 10*3/uL (ref 0.1–1.0)
Monocytes Relative: 10 %
Neutro Abs: 2.4 10*3/uL (ref 1.7–7.7)
Neutrophils Relative %: 66 %
Platelets: 78 10*3/uL — ABNORMAL LOW (ref 150–400)
RBC: 3.12 MIL/uL — ABNORMAL LOW (ref 4.22–5.81)
RDW: 17.8 % — ABNORMAL HIGH (ref 11.5–15.5)
WBC: 3.7 10*3/uL — ABNORMAL LOW (ref 4.0–10.5)
nRBC: 0 % (ref 0.0–0.2)

## 2019-09-25 LAB — BASIC METABOLIC PANEL
Anion gap: 10 (ref 5–15)
BUN: 13 mg/dL (ref 8–23)
CO2: 21 mmol/L — ABNORMAL LOW (ref 22–32)
Calcium: 7.9 mg/dL — ABNORMAL LOW (ref 8.9–10.3)
Chloride: 115 mmol/L — ABNORMAL HIGH (ref 98–111)
Creatinine, Ser: 1.89 mg/dL — ABNORMAL HIGH (ref 0.61–1.24)
GFR calc Af Amer: 43 mL/min — ABNORMAL LOW (ref >60)
GFR calc non Af Amer: 37 mL/min — ABNORMAL LOW (ref >60)
Glucose, Bld: 71 mg/dL (ref 70–99)
Potassium: 4 mmol/L (ref 3.5–5.1)
Sodium: 146 mmol/L — ABNORMAL HIGH (ref 135–145)

## 2019-09-25 LAB — GLUCOSE, CAPILLARY
Glucose-Capillary: 75 mg/dL (ref 70–99)
Glucose-Capillary: 66 mg/dL — ABNORMAL LOW (ref 70–99)
Glucose-Capillary: 74 mg/dL (ref 70–99)
Glucose-Capillary: 71 mg/dL (ref 70–99)
Glucose-Capillary: 75 mg/dL (ref 70–99)
Glucose-Capillary: 90 mg/dL (ref 70–99)
Glucose-Capillary: 93 mg/dL (ref 70–99)

## 2019-09-25 MED ORDER — OXCARBAZEPINE 300 MG PO TABS
450.0000 mg | ORAL_TABLET | Freq: Every day | ORAL | Status: DC
  Administered 2019-09-28 – 2019-09-30 (×3): 450 mg via ORAL
  Filled 2019-09-25 (×7): qty 1

## 2019-09-25 MED ORDER — ENOXAPARIN SODIUM 40 MG/0.4ML ~~LOC~~ SOLN
40.0000 mg | SUBCUTANEOUS | Status: DC
  Administered 2019-09-25 – 2019-09-26 (×2): 40 mg via SUBCUTANEOUS
  Filled 2019-09-25 (×2): qty 0.4

## 2019-09-25 MED ORDER — DEXTROSE 50 % IV SOLN
12.5000 g | INTRAVENOUS | Status: AC
  Administered 2019-09-25 – 2019-09-26 (×2): 12.5 g via INTRAVENOUS

## 2019-09-25 MED ORDER — TAMSULOSIN HCL 0.4 MG PO CAPS
0.4000 mg | ORAL_CAPSULE | Freq: Every day | ORAL | Status: DC
  Administered 2019-09-28 – 2019-09-30 (×3): 0.4 mg via ORAL
  Filled 2019-09-25 (×3): qty 1

## 2019-09-25 MED ORDER — DEXTROSE 50 % IV SOLN
INTRAVENOUS | Status: AC
  Administered 2019-09-25: 10:00:00
  Filled 2019-09-25: qty 50

## 2019-09-25 MED ORDER — TERBINAFINE HCL 1 % EX CREA
TOPICAL_CREAM | Freq: Two times a day (BID) | CUTANEOUS | Status: DC
  Administered 2019-09-25 – 2019-09-26 (×3): via TOPICAL
  Administered 2019-09-27: 1 via TOPICAL
  Administered 2019-09-27 – 2019-09-28 (×3): via TOPICAL
  Administered 2019-09-29: 1 via TOPICAL
  Administered 2019-09-29: 22:00:00 via TOPICAL
  Administered 2019-09-30: 1 via TOPICAL
  Administered 2019-09-30 – 2019-10-01 (×2): via TOPICAL
  Filled 2019-09-25 (×2): qty 12

## 2019-09-25 MED ORDER — ATORVASTATIN CALCIUM 10 MG PO TABS
10.0000 mg | ORAL_TABLET | Freq: Every day | ORAL | Status: DC
  Filled 2019-09-25 (×2): qty 1

## 2019-09-25 MED ORDER — LEVOTHYROXINE SODIUM 100 MCG PO TABS
100.0000 ug | ORAL_TABLET | Freq: Every day | ORAL | Status: DC
  Administered 2019-09-29 – 2019-10-01 (×3): 100 ug via ORAL
  Filled 2019-09-25 (×5): qty 1

## 2019-09-25 MED ORDER — POTASSIUM CHLORIDE 10 MEQ/100ML IV SOLN
INTRAVENOUS | Status: AC
  Filled 2019-09-25: qty 100

## 2019-09-25 MED ORDER — MIDODRINE HCL 5 MG PO TABS
5.0000 mg | ORAL_TABLET | Freq: Two times a day (BID) | ORAL | Status: DC
  Administered 2019-09-29 (×2): 5 mg via ORAL
  Filled 2019-09-25 (×2): qty 1

## 2019-09-25 NOTE — Progress Notes (Signed)
Cortrak Tube Team Note:  Consult received to place a Cortrak feeding tube at 1327. Same day placement is guaranteed for all Cortrak orders placed before 1300  Upon arrival, spoke with RN who indicates pt is going down for CT abdomen/pelvis right now. Cortrak service will not be available after pt's return today. Noted plan for G-tube by Radiology as early as tomorrow. If unable to place G-tube tomorrow and NG tube insertion is desired, pt can have small bore feeding tube placed by Diagnostic Radiology. Discussed with MD.    Cortrak service is available Tuesdays and Fridays at [facility] from 8am to 4pm.   If the tube becomes dislodged please keep the tube and contact the Cortrak team at [email or web address] (password [email or web address]) for replacement.  If after hours and replacement cannot be delayed, place a NG tube and confirm placement with an abdominal x-ray.    [employer] MS, RDN, LDN, CNSC [phone] Pager  [phone] Weekend/On-Call Pager

## 2019-09-25 NOTE — Progress Notes (Signed)
PROGRESS NOTE    [name]  [id number] DOB: Jan 30, 1956 DOA: 09/21/2019 PCP: [facility], [facility]   Brief Narrative:  Jack Mcintyre is a 63 y.o. male with medical history significant of throat cancer and radiation treatment status post trach and PEG in 2015, chronic cough,, history of tobacco and alcohol abuse, history of lung infection, CKD stage III, chronic anemia was brought into the ED on 09/21/2019 by his wife with a concern of decline in his health, weakness, confusion and some shortness of breath.  Of note, he was recently admitted at the [facility] facility from September 28 through October 1.  Reportedly, he is also alcoholic and has been drinking 1 pint of liquor almost daily.  Upon arrival to emergency department, he was slightly hypotensive.  Potassium was 3.2 and hypoglycemic with blood glucose of 66.  He failed bedside swallow evaluation in the emergency department and was subsequently diagnosed with aspiration pneumonia bilateral based on the chest x-ray and CT scan of the lungs.  He was started on broad-spectrum antibiotics and then admitted under [facility] service.  He was tested negative for COVID-19.  Antibiotics for pneumonia were continued.  He was observed to have significant aspiration and gurgling sound in his throat.  SLP was consulted.  MBS was done and he was deemed to have moderate aspiration risk and they recommended alternate means of nutrition.  He initially refused for PEG tube yesterday however he agreed for it today.  IR has been consulted.  NGT was placed yesterday for nutrition which he removed last night.  Cortrak will be placed today.  Assessment & Plan:   Active Problems:   Acute kidney injury (HCC)   Malnutrition of moderate degree   Aspiration pneumonia (HCC)   Alcohol abuse   Hypokalemia   Hypoglycemia   Anemia   Lung nodule  Recurrent aspiration pneumonia/dysphagia: Evaluated by SLP.  MBS done.  Has moderate aspiration risk.  SLP recommends alternative  routes for nutrition. IR was consulted on 09/23/2019 for PEG tube placement.mother in agreement.  We will continue Zosyn for now.  Cultures negative.  Hypokalemia: Resolved.  CKD stage III: Seems to have baseline creatinine of about 1.7.  Currently at baseline.  Continue to watch.  Hypoglycemia: Acceptable hypoglycemia.  Continue dextrose fluid.  Now that he is will be getting some feeding so hopefully his blood sugar will improve.  Alcohol abuse: Drinks about 1 pint of liquor every day.  High risk for withdrawal.  Continue CIWA with multivitamin and PRN Ativan.  No signs of withdrawal so far.  Chronic anemia: Seems to have chronic anemia.  We only have to the lab records from May 2019 through now.  Highest hemoglobin has been 10.3.  Yesterday upon admission he was 7.2.  It is hard to find a baseline for him.  He received 1 unit of PRBC transfusion on 09/21/2019.  Hemoglobin has remained stable since then.  Hypotension/dehydration: His blood pressure continues to remain low with systolic around upper [id number] and diastolic 0000000.  I was finally able to talk to his mom today and she tells me that his blood pressure usually runs low with systolic being around [id number] and diastolic around 60.  He supposed to take midodrine which he does not take along with any other medications.  I will resume his midodrine today.  Lung nodule: This is known.  He follows up with pulmonology as an outpatient.  Anasarca: Due to poor p.o. intake and hypoalbuminemia.  Ascites: Status post 2  L of paracentesis on 09/22/2019.  Fluid so far looks aseptic.  Culture negative so far.  No abdominal pain or tenderness on exam.  DVT prophylaxis: SCD.  Now that his hemoglobin has remained stable for several days so we will start him on Lovenox.   Code Status: Full code Family Communication:  None present at bedside.  I called his mom and finally was able to get a hold of her.  I updated her.  Answered all questions.  According to his mom, he  does not like to take medications and is stubborn. Disposition Plan: TBD  Consultants:   None  Procedures:   None  Antimicrobials:   IV Zosyn 09/21/2019   Subjective: Patient seen and examined.  He is slightly lethargic today.  Seems like he does not want to talk.  Looks comfortable though.  Has a lot of secretions at the trach area.   Objective: Vitals:   09/25/19 0248 09/25/19 0445 09/25/19 0728 09/25/19 0740  BP:   (!) 87/70   Pulse:  92 89 86  Resp:  19 15 19   Temp: 98.3 F (36.8 C)  (!) 97.5 F (36.4 C)   TempSrc: Oral  Oral   SpO2:  95% 100% 97%  Weight:      Height:        Intake/Output Summary (Last 24 hours) at 09/25/2019 1058 Last data filed at 09/25/2019 0714 Gross per 24 hour  Intake -  Output 300 ml  Net -300 ml   Filed Weights   09/21/19 2007 09/22/19 0100  Weight: 68 kg 74.2 kg    Examination:  General exam: Appears calm and comfortable but slightly lethargic Respiratory system: Coarse breath sounds with rhonchi bilaterally. Respiratory effort normal.  Trach in place.  A lot of mucus secretion at the trach area. Cardiovascular system: S1 & S2 heard, RRR. No JVD, murmurs, rubs, gallops or clicks. No pedal edema. Gastrointestinal system: Abdomen is nondistended, soft and nontender. No organomegaly or masses felt. Normal bowel sounds heard. Central nervous system: Slightly lethargic. No focal neurological deficits. Extremities: Symmetric 5 x 5 power. Skin: No rashes, lesions or ulcers.  Psychiatry: Judgement and insight appear poor.  Mood & affect flat.  Data Reviewed: I have personally reviewed following labs and imaging studies  CBC: Recent Labs  Lab 09/21/19 1759 09/22/19 1129 09/23/19 1359 09/24/19 1041  WBC 2.7* 4.7 3.3* 3.0*  NEUTROABS  --  3.6 2.4 2.1  HGB 7.2* 9.9* 10.4* 9.6*  HCT 21.5* 29.0* 31.2* 29.6*  MCV 103.4* 97.6 99.4 100.3*  PLT 105* 109* 96* 85*   Basic Metabolic Panel: Recent Labs  Lab 09/21/19 1759 09/21/19 1922  09/22/19 1129 09/23/19 1359 09/24/19 1041 09/25/19 0352  NA 144  --  145 146* 147* 146*  K 3.2*  --  3.2* 3.2* 3.1* 4.0  CL 107  --  109 114* 114* 115*  CO2 27  --  27 24 24  21*  GLUCOSE 74  --  92 84 86 71  BUN 13  --  12 14 12 13   CREATININE 1.67*  --  1.72* 1.65* 1.73* 1.89*  CALCIUM 8.9  --  8.7* 8.3* 8.0* 7.9*  MG  --  1.7 1.8  --  1.7  --    GFR: Estimated Creatinine Clearance: 42.5 mL/min (A) (by C-G formula based on SCr of 1.89 mg/dL (H)). Liver Function Tests: Recent Labs  Lab 09/21/19 1759 09/22/19 1129 09/23/19 1359  AST 39 31 22  ALT 13 12 12  ALKPHOS 66 55 47  BILITOT 0.8 0.6 0.8  PROT 6.4* 6.4* 5.8*  ALBUMIN 2.5* 2.5* 2.0*   Recent Labs  Lab 09/21/19 2045  LIPASE 36   Recent Labs  Lab 09/21/19 2040  AMMONIA 17   Coagulation Profile: Recent Labs  Lab 09/21/19 1939  INR 1.1   Cardiac Enzymes: No results for input(s): CKTOTAL, CKMB, CKMBINDEX, TROPONINI in the last 168 hours. BNP (last 3 results) No results for input(s): PROBNP in the last 8760 hours. HbA1C: No results for input(s): HGBA1C in the last 72 hours. CBG: Recent Labs  Lab 09/24/19 2150 09/24/19 2317 09/25/19 0245 09/25/19 0723 09/25/19 0923  GLUCAP 121* 93 75 66* 74   Lipid Profile: No results for input(s): CHOL, HDL, LDLCALC, TRIG, CHOLHDL, LDLDIRECT in the last 72 hours. Thyroid Function Tests: Recent Labs    09/22/19 1129  TSH 121.742*   Anemia Panel: Recent Labs    09/22/19 1129  VITAMINB12 1,614*  FOLATE 6.3  FERRITIN 220  TIBC 158*  IRON 47  RETICCTPCT 1.7   Sepsis Labs: Recent Labs  Lab 09/21/19 1922 09/21/19 2040 09/24/19 1358 09/24/19 1626  LATICACIDVEN 1.1 0.9 1.3 1.4    Recent Results (from the past 240 hour(s))  SARS Coronavirus 2 [facility] order, Performed in [facility] hospital lab) Nasopharyngeal Nasopharyngeal Swab     Status: None   Collection Time: 09/21/19  8:10 PM   Specimen: Nasopharyngeal Swab  Result Value Ref Range Status    SARS Coronavirus 2 NEGATIVE NEGATIVE Final    Comment: (NOTE) If result is NEGATIVE SARS-CoV-2 target nucleic acids are NOT DETECTED. The SARS-CoV-2 RNA is generally detectable in upper and lower  respiratory specimens during the acute phase of infection. The lowest  concentration of SARS-CoV-2 viral copies this assay can detect is 250  copies / mL. A negative result does not preclude SARS-CoV-2 infection  and should not be used as the sole basis for treatment or other  patient management decisions.  A negative result may occur with  improper specimen collection / handling, submission of specimen other  than nasopharyngeal swab, presence of viral mutation(s) within the  areas targeted by this assay, and inadequate number of viral copies  (<250 copies / mL). A negative result must be combined with clinical  observations, patient history, and epidemiological information. If result is POSITIVE SARS-CoV-2 target nucleic acids are DETECTED. The SARS-CoV-2 RNA is generally detectable in upper and lower  respiratory specimens dur ing the acute phase of infection.  Positive  results are indicative of active infection with SARS-CoV-2.  Clinical  correlation with patient history and other diagnostic information is  necessary to determine patient infection status.  Positive results do  not rule out bacterial infection or co-infection with other viruses. If result is PRESUMPTIVE POSTIVE SARS-CoV-2 nucleic acids MAY BE PRESENT.   A presumptive positive result was obtained on the submitted specimen  and confirmed on repeat testing.  While 2019 novel coronavirus  (SARS-CoV-2) nucleic acids may be present in the submitted sample  additional confirmatory testing may be necessary for epidemiological  and / or clinical management purposes  to differentiate between  SARS-CoV-2 and other Sarbecovirus currently known to infect humans.  If clinically indicated additional testing with an alternate test   methodology [phone]) is advised. The SARS-CoV-2 RNA is generally  detectable in upper and lower respiratory sp ecimens during the acute  phase of infection. The expected result is Negative. Fact Sheet for Patients:  StrictlyIdeas.no Fact Sheet for Healthcare  Providers: BankingDealers.co.za This test is not yet approved or cleared by the [place] and has been authorized for detection and/or diagnosis of SARS-CoV-2 by FDA under an Emergency Use Authorization (EUA).  This EUA will remain in effect (meaning this test can be used) for the duration of the COVID-19 declaration under Section 564(b)(1) of the Act, 21 U.S.C. section 360bbb-3(b)(1), unless the authorization is terminated or revoked sooner. Performed at [facility], [place] [address], [place], [place] [address]   MRSA PCR Screening     Status: Abnormal   Collection Time: 09/22/19  1:55 AM   Specimen: Nasal Mucosa; Nasopharyngeal  Result Value Ref Range Status   MRSA by PCR POSITIVE (A) NEGATIVE Final    Comment:        The GeneXpert MRSA Assay (FDA approved for NASAL specimens only), is one component of a comprehensive MRSA colonization surveillance program. It is not intended to diagnose MRSA infection nor to guide or monitor treatment for MRSA infections. RESULT CALLED TO, READ BACK BY AND VERIFIED WITHPricilla Larsson RN 09/22/19 0420 JDW Performed at [facility] [address] [place], [place] [address]   Culture, blood (routine x 2)     Status: None (Preliminary result)   Collection Time: 09/22/19 11:58 AM   Specimen: BLOOD RIGHT ARM  Result Value Ref Range Status   Specimen Description BLOOD RIGHT ARM  Final   Special Requests   Final    BOTTLES DRAWN AEROBIC AND ANAEROBIC Blood Culture adequate volume   Culture   Final    NO GROWTH 2 DAYS Performed at [facility], [place] [address] [place], [place] [address]    Report Status PENDING   Incomplete  Culture, blood (routine x 2)     Status: None (Preliminary result)   Collection Time: 09/22/19 11:58 AM   Specimen: BLOOD RIGHT ARM  Result Value Ref Range Status   Specimen Description BLOOD RIGHT ARM  Final   Special Requests   Final    BOTTLES DRAWN AEROBIC AND ANAEROBIC Blood Culture adequate volume   Culture   Final    NO GROWTH 2 DAYS Performed at [facility], [place] [address] [place], [place] [address]    Report Status PENDING  Incomplete  Respiratory Panel by PCR     Status: None   Collection Time: 09/22/19 12:33 PM   Specimen: Nasopharyngeal Swab; Respiratory  Result Value Ref Range Status   Adenovirus NOT DETECTED NOT DETECTED Final   Coronavirus 229E NOT DETECTED NOT DETECTED Final    Comment: (NOTE) The Coronavirus on the Respiratory Panel, DOES NOT test for the novel  Coronavirus (2019 nCoV)    Coronavirus HKU1 NOT DETECTED NOT DETECTED Final   Coronavirus NL63 NOT DETECTED NOT DETECTED Final   Coronavirus OC43 NOT DETECTED NOT DETECTED Final   Metapneumovirus NOT DETECTED NOT DETECTED Final   Rhinovirus / Enterovirus NOT DETECTED NOT DETECTED Final   Influenza A NOT DETECTED NOT DETECTED Final   Influenza B NOT DETECTED NOT DETECTED Final   Parainfluenza Virus 1 NOT DETECTED NOT DETECTED Final   Parainfluenza Virus 2 NOT DETECTED NOT DETECTED Final   Parainfluenza Virus 3 NOT DETECTED NOT DETECTED Final   Parainfluenza Virus 4 NOT DETECTED NOT DETECTED Final   Respiratory Syncytial Virus NOT DETECTED NOT DETECTED Final   Bordetella pertussis NOT DETECTED NOT DETECTED Final   Chlamydophila pneumoniae NOT DETECTED NOT DETECTED Final   Mycoplasma pneumoniae NOT DETECTED NOT DETECTED  Final    Comment: Performed at [facility], [place] [address] [place], [place] [address]  Gram stain     Status: None   Collection Time: 09/22/19  3:35 PM   Specimen: PATH Cytology Peritoneal fluid  Result Value Ref Range Status   Specimen Description PERITONEAL   Final   Special Requests NONE  Final   Gram Stain   Final    NO WBC SEEN NO ORGANISMS SEEN Performed at [facility], [address], [place], [place] [address]    Report Status 09/22/2019 FINAL  Final  Culture, body fluid-bottle     Status: None (Preliminary result)   Collection Time: 09/22/19  3:35 PM   Specimen: Peritoneal Washings  Result Value Ref Range Status   Specimen Description PERITONEAL  Final   Special Requests NONE  Final   Culture   Final    NO GROWTH 2 DAYS Performed at [facility] [address], [place], [place] [address]    Report Status PENDING  Incomplete      Radiology Studies: Dg Abd Portable 1v  Result Date: 09/24/2019 CLINICAL DATA:  Nasogastric tube placement. EXAM: PORTABLE ABDOMEN - 1 VIEW COMPARISON:  Chest CT 09/21/2019. FINDINGS: 1840 hours. Enteric tube is looped in the distal stomach with the tip projecting superiorly to the level of the gastric fundus. The visualized bowel gas pattern is normal. There is contrast material within the right colon. There are prominent paraspinal osteophytes throughout the thoracolumbar spine. Bibasilar pulmonary opacities and pleural effusions are similar to recent chest CT. IMPRESSION: The nasogastric tube is looped in the stomach with the tip projecting over the gastric fundus. Electronically Signed   By: Richardean Sale M.D.   On: 09/24/2019 19:09    Scheduled Meds: . Chlorhexidine Gluconate Cloth  6 each Topical Q0600  . folic acid  1 mg Oral Daily  . multivitamin with minerals  1 tablet Oral Daily  . mupirocin ointment  1 application Nasal BID  . nicotine  14 mg Transdermal Daily  . sodium chloride flush  3 mL Intravenous Once  . thiamine  100 mg Oral Daily   Or  . thiamine  100 mg Intravenous Daily   Continuous Infusions: . dextrose 75 mL/hr at 09/25/19 0617  . piperacillin-tazobactam (ZOSYN)  IV 3.375 g (09/25/19 0017)  . potassium chloride       LOS: 4 days   Time spent: 35 minutes   Darliss Cheney, MD [facility]  09/25/2019, 10:58 AM   To contact the attending provider between 7A-7P or the covering provider during after hours 7P-7A, please log into the web site [email or web address] and use password [email or web address].

## 2019-09-25 NOTE — Progress Notes (Signed)
Pt attempting to remove trach; was loosening trach collar. Provider paged for renewal of bilateral soft wrist restraints.

## 2019-09-25 NOTE — Progress Notes (Signed)
Inpatient Rehab Admissions:  Inpatient Rehab Consult received.  I met with patient and his mother at the bedside for rehabilitation assessment and to discuss goals and expectations of an inpatient rehab admission.  Mother is unable to provide any sort of assist at discharge, pt will need to be independent level, which is not likely to be achieved in a short term rehab. Will need longer term rehab at SNF level, which pt and mother are open to. I will sign off at this time.   Signed: Shann Medal, PT, DPT Admissions Coordinator [phone] 09/25/19  5:25 PM

## 2019-09-25 NOTE — Progress Notes (Signed)
Patient intermittent confused throughput shift, required frequent re-orientation and encouragement to remain calm.  Patient pulled out several PIV and NG Tube.  Hospitalist notified due to patients confusion and agitation.  Orders received to replace NG and apply soft wrist restraints.  Restraints applied and assessed to ensure adequate circulation.  NG tube placement not successful, tube coiled with several attempts.  Patient bladder scanned due to no void residuals greater than 12 hours.  Orders provided for In and out catheter.  Voicemail left for mother Jack Mcintyre to return call for patient status update.  Stable condition, no respiratory distress noted.  Will continue to monitor.

## 2019-09-25 NOTE — Progress Notes (Signed)
Bedside evaluation done for nonviolent restraints performed. Appropriate at this time.   Lovey Newcomer, NP [facility] 7p-7a [phone]

## 2019-09-25 NOTE — Progress Notes (Signed)
Nutrition Follow-up  INTERVENTION:   If diet unable to be advanced and PEG is replaced, recommend initiating: -Osmolite 1.2 @ 30 ml/hr, advance as tolerated 10 ml/hr every 8 hrs to goal rate 75 ml/hr -Prostat 30 ml daily via tube  If NGT desired, Cortrak service is available today 10/6 (or next service available Friday 10/9).  NUTRITION DIAGNOSIS:   Inadequate oral intake related to dysphagia, cancer and cancer related treatments(throat cancer) as evidenced by NPO status, other (comment)(tracheostomy; previous PEG).  Ongoing.  GOAL:   Patient will meet greater than or equal to 90% of their needs  Not meeting.  MONITOR:   Labs, Weight trends, TF tolerance, I & O's  REASON FOR ASSESSMENT:   Consult Enteral/tube feeding initiation and management  ASSESSMENT:   63 year old male with medical history significant of throat cancer and radiation treatment s/p trach and PEG 2015, EtOH abuse, malnutrition, nocardia lung infection, CKD, recently admitted to [facility] 9/28-10/01 for AMS and aspiration who presents with progressive AMS, weakness, and aspiration.  **RD working remotely**  RD consulted for tube feeding initiation. Per nursing notes this AM, pt with confusion and agitation, pt pulled NGT out. RN was unable to place NGT successfully. Now with restraints but no enteral nutrition access.  Will leave TF recs. Cortrak service is available today if desired.   No new weights since 10/3.  I/Os: +3.4L since admit UOP: 300 ml so far today  Medications: [id number] infusion, Folic acid tablet daily, Multivitamin with minerals daily, Thiamine daily, D10 infusion, KCl Labs reviewed: CBGs: 66-74 Elevated Na  Diet Order:   Diet Order            Diet NPO time specified  Diet effective now              EDUCATION NEEDS:   No education needs have been identified at this time  Skin:  Skin Assessment: Reviewed RN Assessment  Last BM:  10/4  Height:   Ht Readings from Last 1  Encounters:  09/21/19 6\' 1"  (1.854 m)    Weight:   Wt Readings from Last 1 Encounters:  09/22/19 74.2 kg    Ideal Body Weight:  83.6 kg  BMI:  Body mass index is 21.58 kg/m.  Estimated Nutritional Needs:   Kcal:  2200-2400  Protein:  110-120  Fluid:  2.2 L/day  Clayton Bibles, MS, RD, LDN Inpatient Clinical Dietitian Pager: [phone] After Hours Pager: [phone]

## 2019-09-25 NOTE — Progress Notes (Signed)
Pt confused and attempting to remove medical equipment including her trach. Order placed for bilateral wrist restraints.  Arby Barrette APRN-C [facility] Pager [phone]

## 2019-09-25 NOTE — Progress Notes (Signed)
Note: SLP MBS reviewed by this therapist. Dysphagia is quite severe and risk of aspiration is high with all PO, particularly while pt is deconditioned. While therapy and PO intake in the future may be considered based on pts and family wishes, no PO trials would be beneficial at this time and may only result in decline, unless pt and family opt for comfort measures. Pt will need SLP interventions into the longer term. Awaiting further plan for nutrition and appropriate f/u education as plan is made.   Herbie Baltimore, [place]  Acute Rehabilitation Services Pager [phone] Office [phone]

## 2019-09-26 ENCOUNTER — Inpatient Hospital Stay (HOSPITAL_COMMUNITY): Payer: Medicaid Other

## 2019-09-26 ENCOUNTER — Inpatient Hospital Stay: Payer: Self-pay

## 2019-09-26 DIAGNOSIS — A419 Sepsis, unspecified organism: Secondary | ICD-10-CM

## 2019-09-26 DIAGNOSIS — N179 Acute kidney failure, unspecified: Secondary | ICD-10-CM

## 2019-09-26 DIAGNOSIS — G9340 Encephalopathy, unspecified: Secondary | ICD-10-CM

## 2019-09-26 DIAGNOSIS — J690 Pneumonitis due to inhalation of food and vomit: Principal | ICD-10-CM

## 2019-09-26 DIAGNOSIS — Z930 Tracheostomy status: Secondary | ICD-10-CM

## 2019-09-26 DIAGNOSIS — R6521 Severe sepsis with septic shock: Secondary | ICD-10-CM

## 2019-09-26 LAB — GLUCOSE, CAPILLARY
Glucose-Capillary: 75 mg/dL (ref 70–99)
Glucose-Capillary: 80 mg/dL (ref 70–99)
Glucose-Capillary: 77 mg/dL (ref 70–99)
Glucose-Capillary: 47 mg/dL — ABNORMAL LOW (ref 70–99)
Glucose-Capillary: 141 mg/dL — ABNORMAL HIGH (ref 70–99)
Glucose-Capillary: 96 mg/dL (ref 70–99)
Glucose-Capillary: 63 mg/dL — ABNORMAL LOW (ref 70–99)

## 2019-09-26 LAB — BASIC METABOLIC PANEL
Anion gap: 9 (ref 5–15)
Anion gap: 8 (ref 5–15)
BUN: 12 mg/dL (ref 8–23)
BUN: 11 mg/dL (ref 8–23)
CO2: 21 mmol/L — ABNORMAL LOW (ref 22–32)
CO2: 20 mmol/L — ABNORMAL LOW (ref 22–32)
Calcium: 7.8 mg/dL — ABNORMAL LOW (ref 8.9–10.3)
Calcium: 7.7 mg/dL — ABNORMAL LOW (ref 8.9–10.3)
Chloride: 111 mmol/L (ref 98–111)
Chloride: 112 mmol/L — ABNORMAL HIGH (ref 98–111)
Creatinine, Ser: 1.81 mg/dL — ABNORMAL HIGH (ref 0.61–1.24)
Creatinine, Ser: 1.67 mg/dL — ABNORMAL HIGH (ref 0.61–1.24)
GFR calc Af Amer: 45 mL/min — ABNORMAL LOW (ref >60)
GFR calc Af Amer: 50 mL/min — ABNORMAL LOW (ref >60)
GFR calc non Af Amer: 39 mL/min — ABNORMAL LOW (ref >60)
GFR calc non Af Amer: 43 mL/min — ABNORMAL LOW (ref >60)
Glucose, Bld: 81 mg/dL (ref 70–99)
Glucose, Bld: 136 mg/dL — ABNORMAL HIGH (ref 70–99)
Potassium: 3.3 mmol/L — ABNORMAL LOW (ref 3.5–5.1)
Potassium: 3.3 mmol/L — ABNORMAL LOW (ref 3.5–5.1)
Sodium: 141 mmol/L (ref 135–145)
Sodium: 140 mmol/L (ref 135–145)

## 2019-09-26 LAB — CBC WITH DIFFERENTIAL/PLATELET
Abs Immature Granulocytes: 0.02 10*3/uL (ref 0.00–0.07)
Basophils Absolute: 0 10*3/uL (ref 0.0–0.1)
Basophils Relative: 1 %
Eosinophils Absolute: 0.1 10*3/uL (ref 0.0–0.5)
Eosinophils Relative: 3 %
HCT: 30 % — ABNORMAL LOW (ref 39.0–52.0)
Hemoglobin: 9.7 g/dL — ABNORMAL LOW (ref 13.0–17.0)
Immature Granulocytes: 1 %
Lymphocytes Relative: 17 %
Lymphs Abs: 0.5 10*3/uL — ABNORMAL LOW (ref 0.7–4.0)
MCH: 32.6 pg (ref 26.0–34.0)
MCHC: 32.3 g/dL (ref 30.0–36.0)
MCV: 100.7 fL — ABNORMAL HIGH (ref 80.0–100.0)
Monocytes Absolute: 0.4 10*3/uL (ref 0.1–1.0)
Monocytes Relative: 14 %
Neutro Abs: 2 10*3/uL (ref 1.7–7.7)
Neutrophils Relative %: 64 %
Platelets: 72 10*3/uL — ABNORMAL LOW (ref 150–400)
RBC: 2.98 MIL/uL — ABNORMAL LOW (ref 4.22–5.81)
RDW: 17.6 % — ABNORMAL HIGH (ref 11.5–15.5)
WBC: 3.1 10*3/uL — ABNORMAL LOW (ref 4.0–10.5)
nRBC: 0 % (ref 0.0–0.2)

## 2019-09-26 LAB — POCT I-STAT 7, (LYTES, BLD GAS, ICA,H+H)
Acid-base deficit: 6 mmol/L — ABNORMAL HIGH (ref 0.0–2.0)
Bicarbonate: 21.7 mmol/L (ref 20.0–28.0)
Calcium, Ion: 1.25 mmol/L (ref 1.15–1.40)
HCT: 29 % — ABNORMAL LOW (ref 39.0–52.0)
Hemoglobin: 9.9 g/dL — ABNORMAL LOW (ref 13.0–17.0)
O2 Saturation: 91 %
Patient temperature: 92
Potassium: 3.1 mmol/L — ABNORMAL LOW (ref 3.5–5.1)
Sodium: 142 mmol/L (ref 135–145)
TCO2: 23 mmol/L (ref 22–32)
pCO2 arterial: 42.2 mmHg (ref 32.0–48.0)
pH, Arterial: 7.299 — ABNORMAL LOW (ref 7.350–7.450)
pO2, Arterial: 55 mmHg — ABNORMAL LOW (ref 83.0–108.0)

## 2019-09-26 MED ORDER — SODIUM CHLORIDE 0.9 % IV BOLUS
1000.0000 mL | Freq: Once | INTRAVENOUS | Status: AC
  Administered 2019-09-26: 1000 mL via INTRAVENOUS

## 2019-09-26 MED ORDER — SODIUM CHLORIDE 0.9% FLUSH
10.0000 mL | Freq: Two times a day (BID) | INTRAVENOUS | Status: DC
  Administered 2019-09-26 – 2019-09-28 (×4): 10 mL

## 2019-09-26 MED ORDER — DEXTROSE 50 % IV SOLN
INTRAVENOUS | Status: AC
  Administered 2019-09-26: 23:00:00 50 mL
  Filled 2019-09-26: qty 50

## 2019-09-26 MED ORDER — DEXTROSE-NACL 5-0.9 % IV SOLN
INTRAVENOUS | Status: DC
  Administered 2019-09-26: 17:00:00 via INTRAVENOUS
  Administered 2019-09-27: 01:00:00 1000 mL via INTRAVENOUS
  Administered 2019-09-27: 09:00:00 10 mL/h via INTRAVENOUS

## 2019-09-26 MED ORDER — VANCOMYCIN HCL IN DEXTROSE 1-5 GM/200ML-% IV SOLN
1000.0000 mg | INTRAVENOUS | Status: DC
  Administered 2019-09-27: 1000 mg via INTRAVENOUS
  Filled 2019-09-26 (×2): qty 200

## 2019-09-26 MED ORDER — ALBUMIN HUMAN 25 % IV SOLN
50.0000 g | Freq: Once | INTRAVENOUS | Status: AC
  Administered 2019-09-26: 21:00:00 50 g via INTRAVENOUS
  Filled 2019-09-26: qty 50

## 2019-09-26 MED ORDER — HYDROCORTISONE NA SUCCINATE PF 100 MG IJ SOLR
50.0000 mg | Freq: Four times a day (QID) | INTRAMUSCULAR | Status: DC
  Administered 2019-09-26 – 2019-09-29 (×13): 50 mg via INTRAVENOUS
  Filled 2019-09-26 (×12): qty 2

## 2019-09-26 MED ORDER — DEXTROSE 50 % IV SOLN
INTRAVENOUS | Status: AC
  Filled 2019-09-26: qty 50

## 2019-09-26 MED ORDER — SODIUM CHLORIDE 0.9% FLUSH: 10.0000 mL | INTRAVENOUS | Status: DC | PRN

## 2019-09-26 MED ORDER — VANCOMYCIN HCL 10 G IV SOLR
1500.0000 mg | Freq: Once | INTRAVENOUS | Status: AC
  Administered 2019-09-26: 23:00:00 1500 mg via INTRAVENOUS
  Filled 2019-09-26 (×2): qty 1500

## 2019-09-26 MED ORDER — DEXTROSE 50 % IV SOLN
INTRAVENOUS | Status: AC
  Administered 2019-09-26: 12.5 g via INTRAVENOUS
  Filled 2019-09-26: qty 50

## 2019-09-26 MED ORDER — SODIUM CHLORIDE 0.9 % IV BOLUS: 1000.0000 mL | Freq: Once | INTRAVENOUS | Status: DC

## 2019-09-26 MED ORDER — POTASSIUM CHLORIDE 10 MEQ/100ML IV SOLN
10.0000 meq | INTRAVENOUS | Status: AC
  Administered 2019-09-26 (×4): 10 meq via INTRAVENOUS
  Filled 2019-09-26 (×2): qty 100

## 2019-09-26 MED ORDER — SODIUM CHLORIDE 0.9 % IV BOLUS
1000.0000 mL | Freq: Once | INTRAVENOUS | Status: AC
  Administered 2019-09-26: 20:00:00 900 mL via INTRAVENOUS

## 2019-09-26 NOTE — Consult Note (Addendum)
NAME:  Jack Mcintyre, MRN:  [id number], DOB:  09/02/1956, LOS: 5 ADMISSION DATE:  09/21/2019, CONSULTATION DATE:  09/26/19 REFERRING MD:  Tawanna Solo- [facility], CHIEF COMPLAINT:  Hypotension  Brief History   63 y.o. M with PMH of throat CA s/p trach and PEG in 2015 who presented  10/2 with concerns for generalized weakness, confusion and dyspnea and found to have aspiration PNA.   He has been treated with Zosyn and family discussed PEG tube, however today his BP has been gradually down-trending and PCCM was consulted for possible septic shock.  History of present illness   Jack Mcintyre is a 63 y.o. M with PMH significant for throat Ca s/p radiation and trach/PEG in 2015, tobacco and alcohol abuse (drinking 1 pint per day prior to admission), CKD and anemia who was admitted 5 days ago with hypotension, weakness and confusion.   CT chest showed extensive frothy debris in the bilateral dependant airways consistent with extensive aspiration.   He has been treated with Zosyn and labs showed leukopenia and elevated creatinine near baseline.  His BP has been soft since admission SBP 88-100, however today began down-trending into the low 80's to 70's and became hypothermic.  He was given 1 L IVF bolus and PCCM consulted.  Of note, PEG tube became dislodged several months ago and plan is for a cor-trac 10/8.  Pt is not a candidate for an open G-tube per surgery, discussion ongoing as to whether pt is a candidate for PEG replacement given his previous procedures  Past Medical History  Throat Ca, chronic dysphagia and respiratory failure s/p PEG and trach, alcohol and tobacco use, cirrhosis  Significant Hospital Events   10/2 admitted   Consults:  PCCM Surgery IR  Procedures:  10/3> Paracentesis with 2.2L off  Significant Diagnostic Tests:  10/02 CT chest>>extensive frothy debris within the dependent airways of the lungs bilaterally with ground-glass and clustered nodular airspace disease in the lung  bases. Interval enlargement of a somewhat spiculated appearing nodule in the 8 focal right upper lobe, measuring 1.7 x 1.3 cm 10/3 Abdominal US>> No acute findings  Micro Data:  10/2 Sars-CoV-2>>neg 10/3 MRSA screen>>positive 10/3 Respiratory viral panel>>not detected 10/3 Peritoneal fluid gram stain/culture>>neg 10/3 BCx2>> 10/7 BC x2>>  Antimicrobials:  Unasyn 10/2 only Zosyn 10/3> Vancomycin 10/7>   Interim history/subjective:  Hypotensibve after fluid bolus AMS Weak appearing  Objective   Blood pressure (!) 80/63, pulse 60, temperature (!) 92 F (33.3 C), temperature source Rectal, resp. rate 16, height 6\' 1"  (1.854 m), weight 74.2 kg, SpO2 98 %.    FiO2 (%):  [28 %] 28 %   Intake/Output Summary (Last 24 hours) at 09/26/2019 1822 Last data filed at 09/26/2019 0400 Gross per 24 hour  Intake 2389.23 ml  Output -  Net 2389.23 ml   Filed Weights   09/21/19 2007 09/22/19 0100  Weight: 68 kg 74.2 kg    Examination: General:  Chronically-ill, pale, frail-appearing M HEENT: MM pink/moist, trach in place Neuro: opens eyes and nods, moving all extremities, not otherwise interactive CV: s1s2 rrr, no m/r/g PULM:  Scattered rhonchi. Diminished bilaterally, no retractions or accessory muscle use,  maintaining adequate oxygen concentrations on 5L trach collar GI: soft, bsx4 active  Extremities: warm/dry, no edema  Skin: no rashes or lesions  Resolved Hospital Problem list   none  Assessment & Plan:   Chronic respiratory failure with airway compromise due to throat cancer -s/p tracheostomy in 2015 P Continue trach collar. May need  vent support if further decline Aggressive suctioning Elevate HOB 30 degrees, pulm hygiene  Follow tracheal aspirate  Acute Encephalopathy -Sepsis vs hepatic vs delirium vs CNS depressing medications. Favor septic etiology  P Minimize sedating medications as able  Delirium Bundle Sepsis as below  Septic Shock -admitted with aspiration  PNA -1L given by triad followed by mIVF -hypothermic  P -MAP goal > 65 -Repeat 1L bolus -Consider albumin in setting of cirrhosis -If pressors needed favor NE  -Check cortisol -Start solucortef -Vanc/zosyn  -Pan culture -Check PCT -Continue Bair hugger  EtOH abuse -admitted 10/2 P CIWA protocol with minimal hits (last dose 09/23/2019) -- likely discontinue soon  B vitamins, multivitamin    Dysphagia with Aspiration -Hx "throat cancer" -aspiration 1-2 weeks before presentation -Hx G tube placement in 2015, dislodged 5-6 months ago P -NPO -Not candidate for PEG per IR and Surgery   CKD III -baseline Cr 1.4-1.9 P MAP goal 65 as above Trend renal indices Trend UOP   Hypoglycemia P -check STAT CBG if AMS -correct PRN  -continue D5NS at 39ml/hr  Cirrhosis with ascites  -Child C cirrhotic -EtOH abuse -s/p 2L para on 09/22/19 -culture neg P Check Ammonia  PRN LFT  Anemia, chronic Thrombocytopenia, chronic P Trend on CBC  Transfuse PRN per protocol  Dc lovenox due to thrombocytopenia, SCD only   Right upper lobe Pulmonary Nodule -1.7x1.3cm  -somewhat spiculated -Hx "throat cancer" P OP follow up if resolves   Failure to thrive -chronic illness with acute on chronic debilitation P -NPO due to dysphagia -Not candidate for G tube with IR or surgery -PT/OT as able  Goals of Care: -Palliative has been consulted for severity of chronic illness -Especially in setting of developing critical illness, goals of care must be established. I worry that interventions aimed at resolving critical illness may likely prolong the patient's suffering.   Best practice:  Diet: NPO  Pain/Anxiety/Delirium protocol (if indicated): na VAP protocol (if indicated): na DVT prophylaxis: SCD only due to thrombocytopenia  GI prophylaxis: protonix  Glucose control: SSI  Mobility: BR  Code Status: Full  Family Communication: Mother updated at bedside  Disposition: Transfer to  ICU for further care  Labs   CBC: Recent Labs  Lab 09/22/19 1129 09/23/19 1359 09/24/19 1041 09/25/19 0352 09/26/19 0357  WBC 4.7 3.3* 3.0* 3.7* 3.1*  NEUTROABS 3.6 2.4 2.1 2.4 2.0  HGB 9.9* 10.4* 9.6* 10.4* 9.7*  HCT 29.0* 31.2* 29.6* 30.0* 30.0*  MCV 97.6 99.4 100.3* 96.2 100.7*  PLT 109* 96* 85* 78* 72*    Basic Metabolic Panel: Recent Labs  Lab 09/21/19 1922 09/22/19 1129 09/23/19 1359 09/24/19 1041 09/25/19 0352 09/26/19 0357  NA  --  145 146* 147* 146* 141  K  --  3.2* 3.2* 3.1* 4.0 3.3*  CL  --  109 114* 114* 115* 111  CO2  --  27 24 24  21* 21*  GLUCOSE  --  92 84 86 71 81  BUN  --  12 14 12 13 12   CREATININE  --  1.72* 1.65* 1.73* 1.89* 1.81*  CALCIUM  --  8.7* 8.3* 8.0* 7.9* 7.8*  MG 1.7 1.8  --  1.7  --   --    GFR: Estimated Creatinine Clearance: 44.4 mL/min (A) (by C-G formula based on SCr of 1.81 mg/dL (H)). Recent Labs  Lab 09/21/19 1922 09/21/19 2040  09/23/19 1359 09/24/19 1041 09/24/19 1358 09/24/19 1626 09/25/19 0352 09/26/19 0357  WBC  --   --    < >  3.3* 3.0*  --   --  3.7* 3.1*  LATICACIDVEN 1.1 0.9  --   --   --  1.3 1.4  --   --    < > = values in this interval not displayed.    Liver Function Tests: Recent Labs  Lab 09/21/19 1759 09/22/19 1129 09/23/19 1359  AST 39 31 22  ALT 13 12 12   ALKPHOS 66 55 47  BILITOT 0.8 0.6 0.8  PROT 6.4* 6.4* 5.8*  ALBUMIN 2.5* 2.5* 2.0*   Recent Labs  Lab 09/21/19 2045  LIPASE 36   Recent Labs  Lab 09/21/19 2040  AMMONIA 17    ABG No results found for: PHART, PCO2ART, PO2ART, HCO3, TCO2, ACIDBASEDEF, O2SAT   Coagulation Profile: Recent Labs  Lab 09/21/19 1939  INR 1.1    Cardiac Enzymes: No results for input(s): CKTOTAL, CKMB, CKMBINDEX, TROPONINI in the last 168 hours.  HbA1C: No results found for: HGBA1C  CBG: Recent Labs  Lab 09/26/19 0356 09/26/19 0738 09/26/19 1250 09/26/19 1628 09/26/19 1749  GLUCAP 75 80 77 47* 141*    Review of Systems:   Unable to  obtain due to level of altered mental status Unable to obtain from patient's mother, at bedside, due to situational anxiety   Past Medical History  He,  has a past medical history of Cancer ([place]).   Surgical History   History reviewed. No pertinent surgical history.   Social History   reports that he has been smoking cigarettes. He has a 10.00 pack-year smoking history. He has never used smokeless tobacco. He reports current alcohol use.   Family History   His family history includes Hypertension in an other family member.   Allergies No Known Allergies   Home Medications  Prior to Admission medications   Medication Sig Start Date End Date Taking? Authorizing Provider  amoxicillin-clavulanate (AUGMENTIN) 875-125 MG tablet Take 1 tablet by mouth 2 (two) times daily. 09/20/19  Yes [provider]  levothyroxine (SYNTHROID) 100 MCG tablet Take 100 mcg by mouth daily before breakfast.    Yes [provider]  midodrine (PROAMATINE) 5 MG tablet Take 5 mg by mouth 2 (two) times daily with a meal.   Yes [provider]  atorvastatin (LIPITOR) 10 MG tablet Take 10 mg by mouth daily.    [provider]  furosemide (LASIX) 40 MG tablet Take 40 mg by mouth 2 (two) times daily.    [provider]  Oxcarbazepine (TRILEPTAL) 300 MG tablet Take 450 mg by mouth at bedtime.    [provider]  tamsulosin (FLOMAX) 0.4 MG CAPS capsule Take 0.4 mg by mouth at bedtime.    [provider]     Critical care time: 40 min     Eliseo Gum MSN, AGACNP-BC [place] [id number] If no answer, [phone] 09/26/2019, 6:22 PM  Attending Note:  63 year old cirrhotic with chronic hypotension and throat cancer with a chronic trach who presents to PCCM with worsening hypotension not responsive to 1L of NS.  Patient is altered per RN staff.  On exam, he is cachectic with clear lungs.  I reviewed CXR myself, no acute  disease noted.  Discussed with PCCM-NP.  Will give 2 more boluses of IVF (total of 3 liters) then switch to albumin given liver condition.  Pan cultures.  Vanc and zosyn.  F/U on cultures.  Check procal and cortisol level.  Lactic acid will be elevated due to liver and renal dysfunction  so will not check.  Try to avoid pressors.  Patient is not a candidate for PEG or G-tube placement per CCS and IR respectively.  Palliative saw patient and they are setting up family meeting as patient's mother asked for full code status.  Will transfer to the ICU for closer monitoring.  PCCM will assume care but continue to pursue DNR status.  The patient is critically ill with multiple organ systems failure and requires high complexity decision making for assessment and support, frequent evaluation and titration of therapies, application of advanced monitoring technologies and extensive interpretation of multiple databases.   Critical Care Time devoted to patient care services described in this note is  45  Minutes. This time reflects time of care of this signee Dr Jennet Maduro. This critical care time does not reflect procedure time, or teaching time or supervisory time of PA/NP/Med student/Med Resident etc but could involve care discussion time.  Rush Farmer, M.D. [facility] Pulmonary/Critical Care Medicine. Pager: [phone]. After hours pager: [phone].

## 2019-09-26 NOTE — Progress Notes (Signed)
Per MD q2h poc gbs. Cortrak in am.

## 2019-09-26 NOTE — Progress Notes (Signed)
Staff requested chaplain to visit with mother at bedside of son who is possibly going to code or not doing well. Mother requested prayer so I had prayer with her and she wanted to call the patient's daughter and let her know he was not doing well and needed hospital phone due to her cell phone battery going out. Someone at desk was nice to see if they could charge for her. [employer]

## 2019-09-26 NOTE — Progress Notes (Signed)
Pharmacy Antibiotic Note  Jack Mcintyre is a 63 y.o. male admitted on 09/21/2019 with aspiration PNA put on Zosyn, now with concern for sepsis.  Pharmacy has been consulted for vancomycin dosing.  Plan: Vancomycin 1500mg  IV x1, then 1000mg  IV every 24 hours Goal AUC 400-550. Expected AUC: 456 SCr used: 1.81 Continue Zosyn 3.375g IV every 8 hours Monitor renal function, Cx and clinical progression Vancomycin levels as needed  Height: 6\' 1"  (185.4 cm) Weight: 163 lb 9.3 oz (74.2 kg) IBW/kg (Calculated) : 79.9  Temp (24hrs), Avg:92 F (33.3 C), Min:92 F (33.3 C), Max:92 F (33.3 C)  Recent Labs  Lab 09/21/19 1922 09/21/19 2040 09/22/19 1129 09/23/19 1359 09/24/19 1041 09/24/19 1358 09/24/19 1626 09/25/19 0352 09/26/19 0357  WBC  --   --  4.7 3.3* 3.0*  --   --  3.7* 3.1*  CREATININE  --   --  1.72* 1.65* 1.73*  --   --  1.89* 1.81*  LATICACIDVEN 1.1 0.9  --   --   --  1.3 1.4  --   --     Estimated Creatinine Clearance: 44.4 mL/min (A) (by C-G formula based on SCr of 1.81 mg/dL (H)).    No Known Allergies  Bertis Ruddy, PharmD Clinical Pharmacist Please check AMION for all [place] numbers 09/26/2019 6:56 PM

## 2019-09-26 NOTE — Progress Notes (Signed)
Patient with history of severe dysphagia.  SLP recommendation is for nutrition via alternative means.  Patient with history of gastrostomy tube placement in 2015 due to head/neck cancer requiring radiation.  His G-tube apparently became dislodged 5-6 months ago and was not replaced.   Patient with a history of alcohol abuse with recent ascites s/p paracentesis 09/22/19 with 2.2 liters removed.  CT Abdomen obtained yesterday shows reaccumulation of fluid.   Patient is not a candidate for percutaneous gastrostomy tube placement in IR.   Ordering MD paged.   Brynda Greathouse, MS RD PA-C 12:02 PM

## 2019-09-26 NOTE — Consult Note (Signed)
[facility] Surgery Consult/Admission Note  Jack Mcintyre Dec 27, 1955  [id number].    Requesting Provider: Dr. Tawanna Solo Chief Complaint/Reason for Consult: Gastric tube placement  HPI:  Patient is a 63 year old male with a history of "throat cancer" surgery (unsure what kind), radiation, open G tube, and tracheostomy in 2015 at a [place] in [place], ETOH abuse, HTN, tobacco abuse, CKD, chronic anemia, malnutrition, newly diagnosed cirrhosis? who was admitted on 10/03 for AMS and aspiration. Pt had a recent admission to the [place] from 09/28-10/01 prior to this admission. Hx is obtained from mother and review of EMR. Mother is at bedside. 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 Mom feels he has been altered since admission. PEG got dislodged about 6 months ago and per his mother he has been eating. She believes he drinks daily but not sure how much. Pt is altered and not responding to questions so history is limited. Patient had 2+L of ascites removed on 10/03 by IR. Nurse states 2 NGT for TF's that patient pulled out. Repeat CT scan today showed changes of a prior gastrotomy tube with gastropexy site, ascites, and cirrhotic morphology of liver. We were asked to see for PEG or open G tube placement. Nurse states pt is having BM's.   ROS:  Review of Systems  Unable to perform ROS: Acuity of condition     Family History  Problem Relation Age of Onset   Hypertension Other     Past Medical History:  Diagnosis Date   Cancer [facility])     History reviewed. No pertinent surgical history.  Social History:  reports that he has been smoking cigarettes. He has a 10.00 pack-year smoking history. He has never used smokeless tobacco. He reports current alcohol use. No history on file for drug.  Allergies: No Known Allergies  Medications Prior to Admission  Medication Sig Dispense Refill   amoxicillin-clavulanate (AUGMENTIN) 875-125 MG tablet Take 1 tablet by mouth 2  (two) times daily.     levothyroxine (SYNTHROID) 100 MCG tablet Take 100 mcg by mouth daily before breakfast.      midodrine (PROAMATINE) 5 MG tablet Take 5 mg by mouth 2 (two) times daily with a meal.     atorvastatin (LIPITOR) 10 MG tablet Take 10 mg by mouth daily.     furosemide (LASIX) 40 MG tablet Take 40 mg by mouth 2 (two) times daily.     Oxcarbazepine (TRILEPTAL) 300 MG tablet Take 450 mg by mouth at bedtime.     tamsulosin (FLOMAX) 0.4 MG CAPS capsule Take 0.4 mg by mouth at bedtime.      Blood pressure 93/71, pulse 65, temperature 98 F (36.7 C), temperature source Oral, resp. rate 17, height 6\' 1"  (1.854 m), weight 74.2 kg, SpO2 99 %.  Physical Exam Vitals signs and nursing note reviewed.  Constitutional:      General: He is not in acute distress.    Appearance: He is cachectic. He is ill-appearing. He is not toxic-appearing or diaphoretic.  HENT:     Head: Normocephalic and atraumatic.     Mouth/Throat:     Lips: Pink.     Mouth: Mucous membranes are moist.     Pharynx: Oropharynx is clear.  Eyes:     Conjunctiva/sclera: Conjunctivae normal.  Neck:     Comments: tracheostomy in place Cardiovascular:     Rate and Rhythm: Normal rate and regular rhythm.     Pulses:  Radial pulses are 2+ on the right side and 2+ on the left side.     Heart sounds: Normal heart sounds.  Pulmonary:     Effort: Pulmonary effort is normal. No respiratory distress.     Breath sounds: Rhonchi present. No wheezing or rales.     Comments: Trach, course breath sounds throughout  Abdominal:     General: Abdomen is protuberant. Bowel sounds are decreased. There is distension.     Palpations: There is fluid wave.     Tenderness: There is no abdominal tenderness.     Hernia: No hernia is present.       Comments: Midline vertical scar noted, to the left of this is site of previous G tube. Both are well healed.   Musculoskeletal:        General: No swelling, deformity or signs  of injury.  Skin:    General: Skin is warm and dry.  Neurological:     Mental Status: He is lethargic.     Coordination: Coordination normal.     Results for orders placed or performed during the hospital encounter of 09/21/19 (from the past 48 hour(s))  Lactic acid, plasma     Status: None   Collection Time: 09/24/19  4:26 PM  Result Value Ref Range   Lactic Acid, Venous 1.4 0.5 - 1.9 mmol/L    Comment: Performed at [facility], [place] [address], [place], [place] [address]  Glucose, capillary     Status: None   Collection Time: 09/24/19  4:26 PM  Result Value Ref Range   Glucose-Capillary 71 70 - 99 mg/dL  Glucose, capillary     Status: Abnormal   Collection Time: 09/24/19  7:15 PM  Result Value Ref Range   Glucose-Capillary 50 (L) 70 - 99 mg/dL  Glucose, capillary     Status: Abnormal   Collection Time: 09/24/19  9:27 PM  Result Value Ref Range   Glucose-Capillary 58 (L) 70 - 99 mg/dL  Glucose, capillary     Status: Abnormal   Collection Time: 09/24/19  9:50 PM  Result Value Ref Range   Glucose-Capillary 121 (H) 70 - 99 mg/dL  Glucose, capillary     Status: None   Collection Time: 09/24/19 11:17 PM  Result Value Ref Range   Glucose-Capillary 93 70 - 99 mg/dL  Glucose, capillary     Status: None   Collection Time: 09/25/19  2:45 AM  Result Value Ref Range   Glucose-Capillary 75 70 - 99 mg/dL  CBC with Differential/Platelet     Status: Abnormal   Collection Time: 09/25/19  3:52 AM  Result Value Ref Range   WBC 3.7 (L) 4.0 - 10.5 K/uL   RBC 3.12 (L) 4.22 - 5.81 MIL/uL   Hemoglobin 10.4 (L) 13.0 - 17.0 g/dL   HCT 30.0 (L) 39.0 - 52.0 %   MCV 96.2 80.0 - 100.0 fL   MCH 33.3 26.0 - 34.0 pg   MCHC 34.7 30.0 - 36.0 g/dL   RDW 17.8 (H) 11.5 - 15.5 %   Platelets 78 (L) 150 - 400 K/uL    Comment: REPEATED TO VERIFY PLATELET COUNT CONFIRMED BY SMEAR SPECIMEN CHECKED FOR CLOTS Immature Platelet Fraction may be clinically indicated, consider ordering this additional  test [id number]    nRBC 0.0 0.0 - 0.2 %   Neutrophils Relative % 66 %   Neutro Abs 2.4 1.7 - 7.7 K/uL   Lymphocytes Relative 16 %   Lymphs Abs 0.6 (L) 0.7 -  4.0 K/uL   Monocytes Relative 10 %   Monocytes Absolute 0.4 0.1 - 1.0 K/uL   Eosinophils Relative 3 %   Eosinophils Absolute 0.1 0.0 - 0.5 K/uL   Basophils Relative 1 %   Basophils Absolute 0.1 0.0 - 0.1 K/uL   Immature Granulocytes 4 %   Abs Immature Granulocytes 0.13 (H) 0.00 - 0.07 K/uL    Comment: Performed at [facility] [address] [place], [place] [id number]  Basic metabolic panel     Status: Abnormal   Collection Time: 09/25/19  3:52 AM  Result Value Ref Range   Sodium 146 (H) 135 - 145 mmol/L   Potassium 4.0 3.5 - 5.1 mmol/L    Comment: NO VISIBLE HEMOLYSIS   Chloride 115 (H) 98 - 111 mmol/L   CO2 21 (L) 22 - 32 mmol/L   Glucose, Bld 71 70 - 99 mg/dL   BUN 13 8 - 23 mg/dL   Creatinine, Ser 1.89 (H) 0.61 - 1.24 mg/dL   Calcium 7.9 (L) 8.9 - 10.3 mg/dL   GFR calc non Af Amer 37 (L) >60 mL/min   GFR calc Af Amer 43 (L) >60 mL/min   Anion gap 10 5 - 15    Comment: Performed at [facility] [address], [place], [place] [address]  Glucose, capillary     Status: Abnormal   Collection Time: 09/25/19  7:23 AM  Result Value Ref Range   Glucose-Capillary 66 (L) 70 - 99 mg/dL  Glucose, capillary     Status: None   Collection Time: 09/25/19  9:23 AM  Result Value Ref Range   Glucose-Capillary 74 70 - 99 mg/dL  Glucose, capillary     Status: None   Collection Time: 09/25/19 12:27 PM  Result Value Ref Range   Glucose-Capillary 71 70 - 99 mg/dL  Glucose, capillary     Status: None   Collection Time: 09/25/19  4:20 PM  Result Value Ref Range   Glucose-Capillary 75 70 - 99 mg/dL  Glucose, capillary     Status: None   Collection Time: 09/25/19  7:51 PM  Result Value Ref Range   Glucose-Capillary 90 70 - 99 mg/dL  Glucose, capillary     Status: None   Collection Time: 09/25/19 11:18 PM  Result Value  Ref Range   Glucose-Capillary 93 70 - 99 mg/dL  Glucose, capillary     Status: None   Collection Time: 09/26/19  3:56 AM  Result Value Ref Range   Glucose-Capillary 75 70 - 99 mg/dL  CBC with Differential/Platelet     Status: Abnormal   Collection Time: 09/26/19  3:57 AM  Result Value Ref Range   WBC 3.1 (L) 4.0 - 10.5 K/uL   RBC 2.98 (L) 4.22 - 5.81 MIL/uL   Hemoglobin 9.7 (L) 13.0 - 17.0 g/dL   HCT 30.0 (L) 39.0 - 52.0 %   MCV 100.7 (H) 80.0 - 100.0 fL   MCH 32.6 26.0 - 34.0 pg   MCHC 32.3 30.0 - 36.0 g/dL   RDW 17.6 (H) 11.5 - 15.5 %   Platelets 72 (L) 150 - 400 K/uL    Comment: REPEATED TO VERIFY SPECIMEN CHECKED FOR CLOTS Immature Platelet Fraction may be clinically indicated, consider ordering this additional test [id number] CONSISTENT WITH PREVIOUS RESULT    nRBC 0.0 0.0 - 0.2 %   Neutrophils Relative % 64 %   Neutro Abs 2.0 1.7 - 7.7 K/uL   Lymphocytes Relative 17 %  Lymphs Abs 0.5 (L) 0.7 - 4.0 K/uL   Monocytes Relative 14 %   Monocytes Absolute 0.4 0.1 - 1.0 K/uL   Eosinophils Relative 3 %   Eosinophils Absolute 0.1 0.0 - 0.5 K/uL   Basophils Relative 1 %   Basophils Absolute 0.0 0.0 - 0.1 K/uL   Immature Granulocytes 1 %   Abs Immature Granulocytes 0.02 0.00 - 0.07 K/uL    Comment: Performed at [facility] [address], [place], [place] [id number]  Basic metabolic panel     Status: Abnormal   Collection Time: 09/26/19  3:57 AM  Result Value Ref Range   Sodium 141 135 - 145 mmol/L   Potassium 3.3 (L) 3.5 - 5.1 mmol/L    Comment: NO VISIBLE HEMOLYSIS   Chloride 111 98 - 111 mmol/L   CO2 21 (L) 22 - 32 mmol/L   Glucose, Bld 81 70 - 99 mg/dL   BUN 12 8 - 23 mg/dL   Creatinine, Ser 1.81 (H) 0.61 - 1.24 mg/dL   Calcium 7.8 (L) 8.9 - 10.3 mg/dL   GFR calc non Af Amer 39 (L) >60 mL/min   GFR calc Af Amer 45 (L) >60 mL/min   Anion gap 9 5 - 15    Comment: Performed at [facility] [address] [place], [place] [address]  Glucose, capillary      Status: None   Collection Time: 09/26/19  7:38 AM  Result Value Ref Range   Glucose-Capillary 80 70 - 99 mg/dL  Glucose, capillary     Status: None   Collection Time: 09/26/19 12:50 PM  Result Value Ref Range   Glucose-Capillary 77 70 - 99 mg/dL   Ct Abdomen Wo Contrast  Result Date: 09/26/2019 CLINICAL DATA:  63 year old male with dysphagia EXAM: CT ABDOMEN WITHOUT CONTRAST TECHNIQUE: Multidetector CT imaging of the abdomen was performed following the standard protocol without IV contrast. COMPARISON:  09/03/2015, CT chest 09/21/2019 FINDINGS: Lower chest: Bilateral pleural fluid with scalloping configuration along the margins in a non dependent position bilaterally indicating some complexity. Mixed ground-glass and nodular opacities of the bilateral lung bases with respiratory motion. Differential attenuation of the blood pool and the myocardium. Hepatobiliary: Irregular/nodular surface of liver. Unremarkable gallbladder with no hyperdense material within the lumen. Pancreas: Unremarkable Spleen: Unremarkable Adrenals/Urinary Tract: Adrenal glands not well evaluated. Right kidney is atrophic with density of the medullary region. No hydronephrosis. Left kidney appears somewhat edematous. No hydronephrosis. Nonobstructing stone at the inferior left collecting system of 1 mm-2 mm. Stomach/Bowel: Small hiatal hernia. Stomach is decompressed. There appears to be a gastropexy site with a small gas locule in the soft tissues, continuous with soft tissue in the body wall. This indicates a previous gastrostomy tube. Small bowel decompressed. Small bowel loops in the left abdomen demonstrates circumferential wall thickening. The wall is not well evaluated given the absence of IV contrast. Partially retained enteric contrast within the colon which is nondistended. Vascular/Lymphatic: Vascular calcifications.  No adenopathy. Other: Low-density ascites throughout the abdomen. Edematous body wall and stranding of  the mesentery. Musculoskeletal: No acute displaced fracture identified. Advanced degenerative changes of the spine. No bony canal narrowing. IMPRESSION: Changes of a prior gastrostomy tube with gastropexy site. Fluid positive state with ascites, mesenteric and body wall edema, and bilateral pleural effusions. Configuration of the bilateral pleural effusions suggest some complexity, with background of lung changes which may indicate inflammation/infection as well as chronic changes/atelectasis. Correlation with any chest imaging may be useful.  Cirrhotic morphology of the liver. 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 Edematous left kidney with no hydronephrosis. If there is concern for ascending urinary tract infection, recommend correlation with urinalysis. Small bowel demonstrates mild wall thickening, predominantly within the left abdomen. This is nonspecific, and may be sequela of fluid positive state, however, enteritis or ischemia not excluded on the current CT. Evidence of anemia. Aortic Atherosclerosis (ICD10-I70.0). Electronically Signed   By: Corrie Mckusick D.O.   On: 09/26/2019 07:48   Dg Abd Portable 1v  Result Date: 09/24/2019 CLINICAL DATA:  Nasogastric tube placement. EXAM: PORTABLE ABDOMEN - 1 VIEW COMPARISON:  Chest CT 09/21/2019. FINDINGS: 1840 hours. Enteric tube is looped in the distal stomach with the tip projecting superiorly to the level of the gastric fundus. The visualized bowel gas pattern is normal. There is contrast material within the right colon. There are prominent paraspinal osteophytes throughout the thoracolumbar spine. Bibasilar pulmonary opacities and pleural effusions are similar to recent chest CT. IMPRESSION: The nasogastric tube is looped in the stomach with the tip projecting over the gastric fundus. Electronically Signed   By: Richardean Sale M.D.   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  Pt would benefit from a PEG tube but with his history of throat cancer, surgery and radiation this may not be an option.  Open G tube may be an option but in the setting of ascites the pt is at high risk for dehiscence of wound and leakage of ascites from wound. Pt is also thrombocytopenic and at a higher risk for bleeding intraoperatively.   I will discuss with Dr. Ninfa Linden to see if PEG vs G tube is an option for this patient.   We recommend a palliative care consult   Thank you for the consult.    Kalman Drape, [facility] 09/26/2019, 2:04 PM Pager: [phone] Consults: [phone] Mon-Fri 7:00 am-4:30 pm Sat-Sun 7:00 am-11:30 am

## 2019-09-26 NOTE — Progress Notes (Signed)
PT Cancellation Note  Patient Details Name: Jack Mcintyre MRN: [id number] DOB: [birth year]   Cancelled Treatment:    Reason Eval/Treat Not Completed: Fatigue/lethargy limiting ability to participate.  Has not had much intake other than IV's and is feeling tired, will retry in the AM.   Ramond Dial 09/26/2019, 3:01 PM   Mee Hives, PT MS Acute Rehab Dept. Number: [place]

## 2019-09-26 NOTE — Significant Event (Signed)
Rapid Response Event Note  Overview: Time Called: 1735 Arrival Time: 1740 Event Type: Hypotension  Initial Focused Assessment: Patient with decreased mental status today.  He will awake and answer orientation questions but then goes right back to sleep.   Lung sounds with rhonchi BP 86/67  Sr 64  RR16  O2 sat 99% on TC Rectal temp 92  Interventions: 1L NS bolus BP 80/64  HR 63 CCM at bedside Shirlee Limerick PA and Dr Nelda Marseille) 2nd L NS started.  Transferred to [place] (if not transferred):  Event Summary: Name of Physician Notified: Shelly Coss at bedside upon my arrival at    Name of Consulting Physician Notified: Dr Nelda Marseille at 1800  Outcome: Transferred (Comment)   Event End: 1910  Raliegh Ip

## 2019-09-26 NOTE — Progress Notes (Signed)
PROGRESS NOTE    Jack Mcintyre  [id number] DOB: 12-16-1956 DOA: 09/21/2019 PCP: [facility]   Brief Narrative:  Patient is a 63 year old male with history of throat  cancer status post radiation treatment, status post trach and PEG in 2015, chronic cough, history of tobacco/alcohol abuse, CKD stage III, chronic anemia who presents here with concern for declining health, weakness, confusion and shortness of breath.  He was noted to be hypotensive on arrival.  He failed bedside swallowing evaluation and was found to have aspiration pneumonia patient with chest x-ray and CT finding.  He was started on broad spectrum antibiotics for aspiration pneumonia. Covid -19  was negative.  SLP was consulted.  MBS was done and was found to have moderate aspiration risks and recommended n.p.o.  We have consulted IR for placement of PEG tube after agreement with family and patient.  Assessment & Plan:   Active Problems:   Acute kidney injury (HCC)   Malnutrition of moderate degree   Aspiration pneumonia (HCC)   Alcohol abuse   Hypokalemia   Hypoglycemia   Anemia   Lung nodule   Recurrent aspiration pneumonia/dysphagia: Speech was following.  MBS was done.  Has moderate aspiration risk.  Recommend n.p.o.  IR consulted for PEG tube placement.But due to ascites ,IR cannot place PEG tube.  We will consult general surgery.  Continue antibiotic for aspiration pneumonia.  He is on Zosyn.  Cultures have been negative. He had PEG tube previously which has been removed. Patient has moderate risk for Gtube under general anesthesia or PEG tube but he will benefit from these procedures because we donot have alternative means of providing nutrition.  History of throat cancer: Status post radiation treatment.  On trach.  Hypotension: Blood pressure continues to remain low in the upper 80s.  As per the family, he has chronic hypotension.  Midodrin resumed.  Getting gentle IV fluids.  Will bolus with IV  fluids if necessary.  CKD stage III: Baseline creatinine 1.4-1.9 .currently kidney function on baseline.  Continue to monitor  Hypokalemia: Supplemented with potassium  Chronic alcohol abuse: History of drinking 1 pint of liquor every day.  Continue for withdrawal.  Chronic anemia: Most likely associated with chronic comorbidities/medical issues,chronic alcohol abuse.  He was transfused with a unit of  PRBC on 09/21/2019.  Currently hemoglobin stable.  Lung nodule: Known finding.  CT chest showed Interval enlargement of a somewhat spiculated appearing nodule in the 8 focal right upper lobe, measuring 1.7 x 1.3 cm, previously 1.3 x 0.6 cm . Concerning for malignany.Outpatient PET CT recommended.Follow-up with pulmonology as an outpatient  Anasarca/ascites: Due to poor oral intake/hypoalbuminemia or liver cirrhosis.  Irregular/nodular surface of liver asper CT abdomen.Status post 2 L of paracentesis on 09/22/2019.  Culture negative.  No abdominal pain or tenderness on exam.  Thrombocytopenia: Chronic.  Associated  with chronic alcohol abuse.  Continue to monitor.       Nutrition Problem: Inadequate oral intake Etiology: dysphagia, cancer and cancer related treatments(throat cancer)      DVT prophylaxis: Lovenox Code Status: Full Family Communication: None Disposition Plan: Undetermined at this point   Consultants: general surgery  Procedures:None  Antimicrobials:  Anti-infectives (From admission, onward)   Start     Dose/Rate Route Frequency Ordered Stop   09/22/19 0800  piperacillin-tazobactam (ZOSYN) IVPB 3.375 g     3.375 g 12.5 mL/hr over 240 Minutes Intravenous Every 8 hours 09/22/19 0112     09/21/19 2145  Ampicillin-Sulbactam (UNASYN) 3  g in sodium chloride 0.9 % 100 mL IVPB     3 g 200 mL/hr over 30 Minutes Intravenous  Once 09/21/19 2139 09/22/19 0020      Subjective: Patient seen and examined at bedside this morning.  Mildly hypertensive.  Systolic blood  pressure in the range of 80s.  Looks alert, awake and communicates.  Does not look in any sort of distress.  Has mild tracheal secretions.  Objective: Vitals:   09/26/19 0747 09/26/19 0749 09/26/19 0932 09/26/19 1114  BP:  (!) 80/63 93/67 (!) 80/62  Pulse: 63 68 65 73  Resp:  14 16 14   Temp:      TempSrc:      SpO2: 100% 99% 98% 99%  Weight:      Height:        Intake/Output Summary (Last 24 hours) at 09/26/2019 1139 Last data filed at 09/26/2019 0400 Gross per 24 hour  Intake 2389.23 ml  Output --  Net 2389.23 ml   Filed Weights   09/21/19 2007 09/22/19 0100  Weight: 68 kg 74.2 kg    Examination:  General exam: Not in distress, extremely deconditioned/debilitated  HEENT: Trach with some secretions. Respiratory system: Bilateral decreased air entry Cardiovascular system: S1 & S2 heard, RRR. No JVD, murmurs, rubs, gallops or clicks. No pedal edema. Gastrointestinal system: Abdomen is mildly distended, soft and nontender. No organomegaly or masses felt. Normal bowel sounds heard. Central nervous system: Alert and awake Extremities: No edema, no clubbing ,no cyanosis, distal peripheral pulses palpable. Skin: No rashes, lesions or ulcers,no icterus ,no pallor   Data Reviewed: I have personally reviewed following labs and imaging studies  CBC: Recent Labs  Lab 09/22/19 1129 09/23/19 1359 09/24/19 1041 09/25/19 0352 09/26/19 0357  WBC 4.7 3.3* 3.0* 3.7* 3.1*  NEUTROABS 3.6 2.4 2.1 2.4 2.0  HGB 9.9* 10.4* 9.6* 10.4* 9.7*  HCT 29.0* 31.2* 29.6* 30.0* 30.0*  MCV 97.6 99.4 100.3* 96.2 100.7*  PLT 109* 96* 85* 78* 72*   Basic Metabolic Panel: Recent Labs  Lab 09/21/19 1922 09/22/19 1129 09/23/19 1359 09/24/19 1041 09/25/19 0352 09/26/19 0357  NA  --  145 146* 147* 146* 141  K  --  3.2* 3.2* 3.1* 4.0 3.3*  CL  --  109 114* 114* 115* 111  CO2  --  27 24 24  21* 21*  GLUCOSE  --  92 84 86 71 81  BUN  --  12 14 12 13 12   CREATININE  --  1.72* 1.65* 1.73* 1.89*  1.81*  CALCIUM  --  8.7* 8.3* 8.0* 7.9* 7.8*  MG 1.7 1.8  --  1.7  --   --    GFR: Estimated Creatinine Clearance: 44.4 mL/min (A) (by C-G formula based on SCr of 1.81 mg/dL (H)). Liver Function Tests: Recent Labs  Lab 09/21/19 1759 09/22/19 1129 09/23/19 1359  AST 39 31 22  ALT 13 12 12   ALKPHOS 66 55 47  BILITOT 0.8 0.6 0.8  PROT 6.4* 6.4* 5.8*  ALBUMIN 2.5* 2.5* 2.0*   Recent Labs  Lab 09/21/19 2045  LIPASE 36   Recent Labs  Lab 09/21/19 2040  AMMONIA 17   Coagulation Profile: Recent Labs  Lab 09/21/19 1939  INR 1.1   Cardiac Enzymes: No results for input(s): CKTOTAL, CKMB, CKMBINDEX, TROPONINI in the last 168 hours. BNP (last 3 results) No results for input(s): PROBNP in the last 8760 hours. HbA1C: No results for input(s): HGBA1C in the last 72 hours. CBG: Recent Labs  Lab 09/25/19 1620 09/25/19 1951 09/25/19 2318 09/26/19 0356 09/26/19 0738  GLUCAP 75 90 93 75 80   Lipid Profile: No results for input(s): CHOL, HDL, LDLCALC, TRIG, CHOLHDL, LDLDIRECT in the last 72 hours. Thyroid Function Tests: No results for input(s): TSH, T4TOTAL, FREET4, T3FREE, THYROIDAB in the last 72 hours. Anemia Panel: No results for input(s): VITAMINB12, FOLATE, FERRITIN, TIBC, IRON, RETICCTPCT in the last 72 hours. Sepsis Labs: Recent Labs  Lab 09/21/19 1922 09/21/19 2040 09/24/19 1358 09/24/19 1626  LATICACIDVEN 1.1 0.9 1.3 1.4    Recent Results (from the past 240 hour(s))  SARS Coronavirus 2 [facility] order, Performed in [facility] hospital lab) Nasopharyngeal Nasopharyngeal Swab     Status: None   Collection Time: 09/21/19  8:10 PM   Specimen: Nasopharyngeal Swab  Result Value Ref Range Status   SARS Coronavirus 2 NEGATIVE NEGATIVE Final    Comment: (NOTE) If result is NEGATIVE SARS-CoV-2 target nucleic acids are NOT DETECTED. The SARS-CoV-2 RNA is generally detectable in upper and lower  respiratory specimens during the acute phase of infection. The  lowest  concentration of SARS-CoV-2 viral copies this assay can detect is 250  copies / mL. A negative result does not preclude SARS-CoV-2 infection  and should not be used as the sole basis for treatment or other  patient management decisions.  A negative result may occur with  improper specimen collection / handling, submission of specimen other  than nasopharyngeal swab, presence of viral mutation(s) within the  areas targeted by this assay, and inadequate number of viral copies  (<250 copies / mL). A negative result must be combined with clinical  observations, patient history, and epidemiological information. If result is POSITIVE SARS-CoV-2 target nucleic acids are DETECTED. The SARS-CoV-2 RNA is generally detectable in upper and lower  respiratory specimens dur ing the acute phase of infection.  Positive  results are indicative of active infection with SARS-CoV-2.  Clinical  correlation with patient history and other diagnostic information is  necessary to determine patient infection status.  Positive results do  not rule out bacterial infection or co-infection with other viruses. If result is PRESUMPTIVE POSTIVE SARS-CoV-2 nucleic acids MAY BE PRESENT.   A presumptive positive result was obtained on the submitted specimen  and confirmed on repeat testing.  While 2019 novel coronavirus  (SARS-CoV-2) nucleic acids may be present in the submitted sample  additional confirmatory testing may be necessary for epidemiological  and / or clinical management purposes  to differentiate between  SARS-CoV-2 and other Sarbecovirus currently known to infect humans.  If clinically indicated additional testing with an alternate test  methodology [phone]) is advised. The SARS-CoV-2 RNA is generally  detectable in upper and lower respiratory sp ecimens during the acute  phase of infection. The expected result is Negative. Fact Sheet for Patients:   StrictlyIdeas.no Fact Sheet for Healthcare Providers: BankingDealers.co.za This test is not yet approved or cleared by the [place] FDA and has been authorized for detection and/or diagnosis of SARS-CoV-2 by FDA under an Emergency Use Authorization (EUA).  This EUA will remain in effect (meaning this test can be used) for the duration of the COVID-19 declaration under Section 564(b)(1) of the Act, 21 U.S.C. section 360bbb-3(b)(1), unless the authorization is terminated or revoked sooner. Performed at [facility], [place] [address], [place], [place] [address]   MRSA PCR Screening     Status: Abnormal   Collection Time: 09/22/19  1:55 AM   Specimen: Nasal Mucosa; Nasopharyngeal  Result Value Ref Range Status   MRSA by PCR POSITIVE (A) NEGATIVE Final    Comment:        The GeneXpert MRSA Assay (FDA approved for NASAL specimens only), is one component of a comprehensive MRSA colonization surveillance program. It is not intended to diagnose MRSA infection nor to guide or monitor treatment for MRSA infections. RESULT CALLED TO, READ BACK BY AND VERIFIED WITHPricilla Larsson RN 09/22/19 0420 JDW Performed at [facility] [address], [place], [place] [address]   Culture, blood (routine x 2)     Status: None (Preliminary result)   Collection Time: 09/22/19 11:58 AM   Specimen: BLOOD RIGHT ARM  Result Value Ref Range Status   Specimen Description BLOOD RIGHT ARM  Final   Special Requests   Final    BOTTLES DRAWN AEROBIC AND ANAEROBIC Blood Culture adequate volume   Culture   Final    NO GROWTH 4 DAYS Performed at [facility], [place] [address] [place], [place] [address]    Report Status PENDING  Incomplete  Culture, blood (routine x 2)     Status: None (Preliminary result)   Collection Time: 09/22/19 11:58 AM   Specimen: BLOOD RIGHT ARM  Result Value Ref Range Status   Specimen Description BLOOD RIGHT ARM  Final    Special Requests   Final    BOTTLES DRAWN AEROBIC AND ANAEROBIC Blood Culture adequate volume   Culture   Final    NO GROWTH 4 DAYS Performed at [facility], [place] [address], [place], [place] [address]    Report Status PENDING  Incomplete  Respiratory Panel by PCR     Status: None   Collection Time: 09/22/19 12:33 PM   Specimen: Nasopharyngeal Swab; Respiratory  Result Value Ref Range Status   Adenovirus NOT DETECTED NOT DETECTED Final   Coronavirus 229E NOT DETECTED NOT DETECTED Final    Comment: (NOTE) The Coronavirus on the Respiratory Panel, DOES NOT test for the novel  Coronavirus (2019 nCoV)    Coronavirus HKU1 NOT DETECTED NOT DETECTED Final   Coronavirus NL63 NOT DETECTED NOT DETECTED Final   Coronavirus OC43 NOT DETECTED NOT DETECTED Final   Metapneumovirus NOT DETECTED NOT DETECTED Final   Rhinovirus / Enterovirus NOT DETECTED NOT DETECTED Final   Influenza A NOT DETECTED NOT DETECTED Final   Influenza B NOT DETECTED NOT DETECTED Final   Parainfluenza Virus 1 NOT DETECTED NOT DETECTED Final   Parainfluenza Virus 2 NOT DETECTED NOT DETECTED Final   Parainfluenza Virus 3 NOT DETECTED NOT DETECTED Final   Parainfluenza Virus 4 NOT DETECTED NOT DETECTED Final   Respiratory Syncytial Virus NOT DETECTED NOT DETECTED Final   Bordetella pertussis NOT DETECTED NOT DETECTED Final   Chlamydophila pneumoniae NOT DETECTED NOT DETECTED Final   Mycoplasma pneumoniae NOT DETECTED NOT DETECTED Final    Comment: Performed at [facility], [place]. [address], [place], [place] [address]  Gram stain     Status: None   Collection Time: 09/22/19  3:35 PM   Specimen: PATH Cytology Peritoneal fluid  Result Value Ref Range Status   Specimen Description PERITONEAL  Final   Special Requests NONE  Final   Gram Stain   Final    NO WBC SEEN NO ORGANISMS SEEN Performed at [facility], [address] [place],  [address]    Report Status 09/22/2019 FINAL  Final  Culture,  body fluid-bottle     Status:  None (Preliminary result)   Collection Time: 09/22/19  3:35 PM   Specimen: Peritoneal Washings  Result Value Ref Range Status   Specimen Description PERITONEAL  Final   Special Requests NONE  Final   Culture   Final    NO GROWTH 4 DAYS Performed at [facility], [address] [place], [place] [address]    Report Status PENDING  Incomplete         Radiology Studies: Ct Abdomen Wo Contrast  Result Date: 09/26/2019 CLINICAL DATA:  63 year old male with dysphagia EXAM: CT ABDOMEN WITHOUT CONTRAST TECHNIQUE: Multidetector CT imaging of the abdomen was performed following the standard protocol without IV contrast. COMPARISON:  09/03/2015, CT chest 09/21/2019 FINDINGS: Lower chest: Bilateral pleural fluid with scalloping configuration along the margins in a non dependent position bilaterally indicating some complexity. Mixed ground-glass and nodular opacities of the bilateral lung bases with respiratory motion. Differential attenuation of the blood pool and the myocardium. Hepatobiliary: Irregular/nodular surface of liver. Unremarkable gallbladder with no hyperdense material within the lumen. Pancreas: Unremarkable Spleen: Unremarkable Adrenals/Urinary Tract: Adrenal glands not well evaluated. Right kidney is atrophic with density of the medullary region. No hydronephrosis. Left kidney appears somewhat edematous. No hydronephrosis. Nonobstructing stone at the inferior left collecting system of 1 mm-2 mm. Stomach/Bowel: Small hiatal hernia. Stomach is decompressed. There appears to be a gastropexy site with a small gas locule in the soft tissues, continuous with soft tissue in the body wall. This indicates a previous gastrostomy tube. Small bowel decompressed. Small bowel loops in the left abdomen demonstrates circumferential wall thickening. The wall is not well evaluated given the absence of IV contrast. Partially retained enteric contrast within the colon which is  nondistended. Vascular/Lymphatic: Vascular calcifications.  No adenopathy. Other: Low-density ascites throughout the abdomen. Edematous body wall and stranding of the mesentery. Musculoskeletal: No acute displaced fracture identified. Advanced degenerative changes of the spine. No bony canal narrowing. IMPRESSION: Changes of a prior gastrostomy tube with gastropexy site. Fluid positive state with ascites, mesenteric and body wall edema, and bilateral pleural effusions. Configuration of the bilateral pleural effusions suggest some complexity, with background of lung changes which may indicate inflammation/infection as well as chronic changes/atelectasis. Correlation with any chest imaging may be useful. Cirrhotic morphology of the liver. 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 Edematous left kidney with no hydronephrosis. If there is concern for ascending urinary tract infection, recommend correlation with urinalysis. Small bowel demonstrates mild wall thickening, predominantly within the left abdomen. This is nonspecific, and may be sequela of fluid positive state, however, enteritis or ischemia not excluded on the current CT. Evidence of anemia. Aortic Atherosclerosis (ICD10-I70.0). Electronically Signed   By: Corrie Mckusick D.O.   On: 09/26/2019 07:48   Dg Abd Portable 1v  Result Date: 09/24/2019 CLINICAL DATA:  Nasogastric tube placement. EXAM: PORTABLE ABDOMEN - 1 VIEW COMPARISON:  Chest CT 09/21/2019. FINDINGS: 1840 hours. Enteric tube is looped in the distal stomach with the tip projecting superiorly to the level of the gastric fundus. The visualized bowel gas pattern is normal. There is contrast material within the right colon. There are prominent paraspinal osteophytes throughout the thoracolumbar spine. Bibasilar pulmonary opacities and pleural effusions are similar to recent chest CT. IMPRESSION: The nasogastric tube is looped in the stomach with the tip projecting over the  gastric fundus. Electronically Signed   By: Richardean Sale M.D.   On: 09/24/2019 19:09        Scheduled Meds:  atorvastatin  10 mg Oral Daily   Chlorhexidine Gluconate Cloth  6 each Topical Q0600   enoxaparin (LOVENOX) injection  40 mg Subcutaneous [id number]   folic acid  1 mg Oral Daily   levothyroxine  100 mcg Oral Q0600   midodrine  5 mg Oral BID WC   multivitamin with minerals  1 tablet Oral Daily   mupirocin ointment  1 application Nasal BID   nicotine  14 mg Transdermal Daily   Oxcarbazepine  450 mg Oral QHS   sodium chloride flush  3 mL Intravenous Once   tamsulosin  0.4 mg Oral QHS   terbinafine   Topical BID   thiamine  100 mg Oral Daily   Or   thiamine  100 mg Intravenous Daily   Continuous Infusions:  dextrose 75 mL/hr at 09/25/19 0800   piperacillin-tazobactam (ZOSYN)  IV 3.375 g (09/25/19 2358)   potassium chloride 10 mEq (09/26/19 1118)     LOS: 5 days    Time spent: 35 mins.More than 50% of that time was spent in counseling and/or coordination of care.      Shelly Coss, MD [facility] Pager [phone]  If 7PM-7AM, please contact night-coverage [email or web address] Password [email or web address] 09/26/2019, 11:39 AM

## 2019-09-26 NOTE — Progress Notes (Signed)
Went to patient room to place PICC line. Unable to place PICC at this time, patient blood sugar is low, systolic BP on [id number]. Left midline inserted, no visible vein to stick on lower forearm with IV infiltration. Will reevaluate in am.

## 2019-09-26 NOTE — Progress Notes (Signed)
Pt has been receiving daily nicotine patches; states that it is not entirely effective and wants a cigarette

## 2019-09-26 NOTE — Progress Notes (Signed)
Significant Event Note  Patient noted to have a blood glucose of 47 in early evening, also at this time more lethargic, hypotensive and hypothermic.   RRT consulted  Patient evaluated by MD, Dr. Tawanna Solo.   CCM consulted.   Patient transferred to ICU for closer monitoring and more intensive care.   Patients mother updated at bedside.

## 2019-09-27 DIAGNOSIS — R579 Shock, unspecified: Secondary | ICD-10-CM

## 2019-09-27 LAB — COMPREHENSIVE METABOLIC PANEL
ALT: 9 U/L (ref 0–44)
AST: 16 U/L (ref 15–41)
Albumin: 2.4 g/dL — ABNORMAL LOW (ref 3.5–5.0)
Alkaline Phosphatase: 38 U/L (ref 38–126)
Anion gap: 7 (ref 5–15)
BUN: 11 mg/dL (ref 8–23)
CO2: 20 mmol/L — ABNORMAL LOW (ref 22–32)
Calcium: 7.8 mg/dL — ABNORMAL LOW (ref 8.9–10.3)
Chloride: 115 mmol/L — ABNORMAL HIGH (ref 98–111)
Creatinine, Ser: 1.78 mg/dL — ABNORMAL HIGH (ref 0.61–1.24)
GFR calc Af Amer: 46 mL/min — ABNORMAL LOW (ref >60)
GFR calc non Af Amer: 40 mL/min — ABNORMAL LOW (ref >60)
Glucose, Bld: 96 mg/dL (ref 70–99)
Potassium: 3.8 mmol/L (ref 3.5–5.1)
Sodium: 142 mmol/L (ref 135–145)
Total Bilirubin: 0.6 mg/dL (ref 0.3–1.2)
Total Protein: 5.8 g/dL — ABNORMAL LOW (ref 6.5–8.1)

## 2019-09-27 LAB — CULTURE, BLOOD (ROUTINE X 2)
Culture: NO GROWTH
Culture: NO GROWTH
Special Requests: ADEQUATE
Special Requests: ADEQUATE

## 2019-09-27 LAB — CBC WITH DIFFERENTIAL/PLATELET
Abs Immature Granulocytes: 0.02 10*3/uL (ref 0.00–0.07)
Basophils Absolute: 0 10*3/uL (ref 0.0–0.1)
Basophils Relative: 0 %
Eosinophils Absolute: 0 10*3/uL (ref 0.0–0.5)
Eosinophils Relative: 0 %
HCT: 28.3 % — ABNORMAL LOW (ref 39.0–52.0)
Hemoglobin: 9.4 g/dL — ABNORMAL LOW (ref 13.0–17.0)
Immature Granulocytes: 1 %
Lymphocytes Relative: 10 %
Lymphs Abs: 0.4 10*3/uL — ABNORMAL LOW (ref 0.7–4.0)
MCH: 32.9 pg (ref 26.0–34.0)
MCHC: 33.2 g/dL (ref 30.0–36.0)
MCV: 99 fL (ref 80.0–100.0)
Monocytes Absolute: 0.1 10*3/uL (ref 0.1–1.0)
Monocytes Relative: 3 %
Neutro Abs: 3.1 10*3/uL (ref 1.7–7.7)
Neutrophils Relative %: 86 %
Platelets: 81 10*3/uL — ABNORMAL LOW (ref 150–400)
RBC: 2.86 MIL/uL — ABNORMAL LOW (ref 4.22–5.81)
RDW: 17.2 % — ABNORMAL HIGH (ref 11.5–15.5)
WBC: 3.7 10*3/uL — ABNORMAL LOW (ref 4.0–10.5)
nRBC: 0 % (ref 0.0–0.2)

## 2019-09-27 LAB — GLUCOSE, CAPILLARY
Glucose-Capillary: 106 mg/dL — ABNORMAL HIGH (ref 70–99)
Glucose-Capillary: 82 mg/dL (ref 70–99)
Glucose-Capillary: 83 mg/dL (ref 70–99)
Glucose-Capillary: 86 mg/dL (ref 70–99)
Glucose-Capillary: 89 mg/dL (ref 70–99)
Glucose-Capillary: 91 mg/dL (ref 70–99)
Glucose-Capillary: 92 mg/dL (ref 70–99)
Glucose-Capillary: 97 mg/dL (ref 70–99)

## 2019-09-27 LAB — CORTISOL: Cortisol, Plasma: 52 ug/dL

## 2019-09-27 LAB — CULTURE, BODY FLUID W GRAM STAIN -BOTTLE: Culture: NO GROWTH

## 2019-09-27 LAB — PROCALCITONIN
Procalcitonin: <0.1 ng/mL
Procalcitonin: <0.1 ng/mL

## 2019-09-27 LAB — AMMONIA: Ammonia: 47 umol/L — ABNORMAL HIGH (ref 9–35)

## 2019-09-27 LAB — MAGNESIUM: Magnesium: 1.4 mg/dL — ABNORMAL LOW (ref 1.7–2.4)

## 2019-09-27 LAB — LACTIC ACID, PLASMA: Lactic Acid, Venous: 1.3 mmol/L (ref 0.5–1.9)

## 2019-09-27 MED ORDER — NOREPINEPHRINE 4 MG/250ML-% IV SOLN
0.0000 ug/min | INTRAVENOUS | Status: DC
  Administered 2019-09-27: 01:00:00 2 ug/min via INTRAVENOUS
  Filled 2019-09-27: qty 250

## 2019-09-27 MED ORDER — SODIUM CHLORIDE 0.9% FLUSH: 10.0000 mL | INTRAVENOUS | Status: DC | PRN

## 2019-09-27 MED ORDER — CHLORHEXIDINE GLUCONATE CLOTH 2 % EX PADS
6.0000 | MEDICATED_PAD | Freq: Every day | CUTANEOUS | Status: DC
  Administered 2019-09-28 – 2019-10-01 (×4): 6 via TOPICAL

## 2019-09-27 MED ORDER — POTASSIUM CHLORIDE 10 MEQ/50ML IV SOLN: 10.0000 meq | INTRAVENOUS | Status: DC

## 2019-09-27 MED ORDER — SODIUM CHLORIDE 0.9% FLUSH
10.0000 mL | Freq: Two times a day (BID) | INTRAVENOUS | Status: DC
  Administered 2019-09-27 – 2019-09-28 (×3): 10 mL

## 2019-09-27 MED ORDER — POTASSIUM CHLORIDE 10 MEQ/100ML IV SOLN
10.0000 meq | INTRAVENOUS | Status: AC
  Administered 2019-09-27 (×4): 10 meq via INTRAVENOUS
  Filled 2019-09-27 (×4): qty 100

## 2019-09-27 MED ORDER — MAGNESIUM SULFATE 4 GM/100ML IV SOLN
4.0000 g | Freq: Once | INTRAVENOUS | Status: AC
  Administered 2019-09-27: 13:00:00 4 g via INTRAVENOUS
  Filled 2019-09-27: qty 100

## 2019-09-27 NOTE — Progress Notes (Signed)
Attempted IV on this patient and was unable to thread catheter, Spoke with Jacques Navy and she stated at this time Midline is okay, and can wait for the PICC line

## 2019-09-27 NOTE — Evaluation (Signed)
[facility]y-Mu[facility] Speaking Valve - Evaluation Patient Details  Name: DRISTEN BUDZYNSKI MRN: [id number] Date of Birth: Feb 19, 1956  Today's Date: 09/27/2019 Time: [id number] SLP Time Calculation (min) (ACUTE ONLY): 16 min  Past Medical History:  Past Medical History:  Diagnosis Date  . Cancer [facility])    Past Surgical History: History reviewed. No pertinent surgical history. HPI:  Patient is a 63 y.o. male with PMH: throat cancer and radiation treatment s/p trach and PEG in 2015,  chronic cough, h/o tobacco and alcohol abuse, h/o lung infection, CKD stage III, chronic anemia, who was brought to ED on 10/2 with decline in health, weakness, confusion and some SOB. Patient was recent admit to [place] from 9/28 to 10/1. CXR revealed aspiration PNA bilaterally; Covid 19 test negative. Patient's PEG tube "fell out" 5-weeks and has not been replaced.  MBS 10/2 revealed severe dysphagia; pt not a candidate for PEG per chart review.  Palliative care consult is pending.    Assessment / Plan / Recommendation Clinical Impression  Pt evaluated for use of PMV.  Has cuffless #6 trach.  RN provided tracheal suctioning (copious, frothy secretions present).  Valve placed with intermittent removal to assess for air trapping.  Pt tolerated valve beautifully with SP02 remaining at 100%, no changes in VS, achievement of hoarse but intelligible speech.  Upper airway patent.  Pt oriented to person, place, year.  He stated that he has worn valve before -after investigation, found prior valve in marked container in his room.  Recommend that staff place valve whenever they are present in the room.  [facility] meeting is planned for today with family; please place valve so that pt may participate in conversation and decision-making.  SLP will follow for safety/education. D/W RN.  SLP Visit Diagnosis: Aphonia (R49.1)    SLP Assessment  Patient needs continued Speech Lanaguage Pathology Services    Follow Up Recommendations  Other  (comment)(tba)    Frequency and Duration min 1 x/week  2 weeks    PMSV Trial PMSV was placed for: 10 Able to redirect subglottic air through upper airway: Yes Able to Attain Phonation: Yes Voice Quality: Hoarse Able to Expectorate Secretions: No attempts Breath Support for Phonation: Adequate Intelligibility: Intelligibility reduced Phrase: 75-100% accurate Sentence: 75-100% accurate Conversation: 75-100% accurate SpO2 During Trial: 100 % Behavior: Alert;Controlled   Tracheostomy Tube  Additional Tracheostomy Tube Assessment Fenestrated: No    Vent Dependency  Vent Dependent: No FiO2 (%): (S) 40 %    Cuff Deflation Trial  GO Tolerated Cuff Deflation: (n/a)        Jayonna Meyering Laurice 09/27/2019, 9:07 AM Estill Bamberg L. Tivis Ringer, [place] Office number [phone] Pager [phone]

## 2019-09-27 NOTE — Progress Notes (Signed)
Physical Therapy Treatment Patient Details Name: Jack Mcintyre MRN: [id number] DOB: [birth year] Today's Date: 09/27/2019    History of Present Illness 63 y.o. male with medical history significant of throat cancer and radiation treatment status post trach and PEG this 2015, chronic cough,, history of tobacco and alcohol abuse, history of nocardia lung infection, CKD, anemia. Presented to ED 09/21/19 with progressive worsening has been having weakness altered mental status and aspiration recently admitted to [facility] and then continued to decline from there.  He has a chronic cough still productive of sputum continues to drink alcohol smokes half a pack a day. He had a PEG  in place but has been out 5-6 months ago it slipped out and never got replaced. He was tested negative for COVID-19.    PT Comments    Pt in bed with Bairhugger on 38 degrees, but willing to work with therapy. Pt is limited in safe mobility by decreased strength and endurance. Pt is currently min A for coming to EoB. Once EoB able to participate in seated exercise. Pt agreeable to attempt sit to stand, with modA from therapist and use of straight chair for balance. Pt able to take lateral steps along side of bed, utilizing support on LE from side of bed for balance. Pt able to stand approximately 4 minutes before requesting to use bed pan. Pt requires modA for return of LE back into bed. Pt able to bridge bottom up for placement of bedpan.  RN and mother in room at end of session.  PT will continue to follow acutely.     Follow Up Recommendations  Supervision/Assistance - 24 hour;CIR     Equipment Recommendations  Other (comment)    Recommendations for Other Services OT consult     Precautions / Restrictions Precautions Precautions: Fall Precaution Comments: has decannulated himself when agitated Restrictions Weight Bearing Restrictions: No Other Position/Activity Restrictions: monitor when pulling up in bed    Mobility  Bed Mobility Overal bed mobility: Needs Assistance Bed Mobility: Supine to Sit;Sit to Supine     Supine to sit: Min assist Sit to supine: Mod assist   General bed mobility comments: slow processing of commands  Transfers Overall transfer level: Needs assistance Equipment used: 1 person hand held assist Transfers: Sit to/from Stand Sit to Stand: Mod assist         General transfer comment: modA for power up, braces his calves against bed of stabilizing,   Ambulation/Gait Ambulation/Gait assistance: Min assist Gait Distance (Feet): 3 Feet Assistive device: 1 person hand held assist Gait Pattern/deviations: Step-to pattern;Decreased stride length;Trunk flexed;Wide base of support Gait velocity: reduced Gait velocity interpretation: <1.31 ft/sec, indicative of household ambulator General Gait Details: sidestepping to HoB       Balance Overall balance assessment: Needs assistance Sitting-balance support: Feet supported;Bilateral upper extremity supported Sitting balance-Leahy Scale: Fair   Postural control: Posterior lean Standing balance support: Bilateral upper extremity supported;During functional activity Standing balance-Leahy Scale: Poor                              Cognition Arousal/Alertness: Awake/alert Behavior During Therapy: Impulsive Overall Cognitive Status: Impaired/Different from baseline Area of Impairment: Following commands;Awareness;Safety/judgement;Problem solving                       Following Commands: Follows one step commands inconsistently;Follows one step commands with increased time Safety/Judgement: Decreased awareness of safety;Decreased awareness of deficits Awareness:  Intellectual Problem Solving: Slow processing;Difficulty sequencing;Requires verbal cues;Requires tactile cues General Comments: requires multimodal cuing to standing      Exercises General Exercises - Lower Extremity Ankle Circles/Pumps: AROM;10  reps;Seated Long Arc Quad: AROM;Both;10 reps;Seated Hip Flexion/Marching: AROM;Both;10 reps;Seated    General Comments General comments (skin integrity, edema, etc.): Pt's mother in room, encouraging pt to participate, pt with Passy-Muir valve in place, maintains SaO2 >90%O2 on trach collar, FiO2 40% and 10L O2, max HR 120bpm with movement      Pertinent Vitals/Pain Pain Assessment: Faces Faces Pain Scale: Hurts a little bit Pain Location: generalized Pain Descriptors / Indicators: Grimacing Pain Intervention(s): Limited activity within patient's tolerance;Monitored during session;Repositioned           PT Goals (current goals can now be found in the care plan section) Acute Rehab PT Goals Patient Stated Goal: mom and patient to get stronger PT Goal Formulation: Patient unable to participate in goal setting Time For Goal Achievement: 10/07/19 Potential to Achieve Goals: Good Progress towards PT goals: Progressing toward goals    Frequency    Min 3X/week      PT Plan Discharge plan needs to be updated       AM-PAC PT "6 Clicks" Mobility   Outcome Measure  Help needed turning from your back to your side while in a flat bed without using bedrails?: A Little Help needed moving from lying on your back to sitting on the side of a flat bed without using bedrails?: A Lot Help needed moving to and from a bed to a chair (including a wheelchair)?: A Lot Help needed standing up from a chair using your arms (e.g., wheelchair or bedside chair)?: A Lot Help needed to walk in hospital room?: A Little Help needed climbing 3-5 steps with a railing? : Total 6 Click Score: 13    End of Session Equipment Utilized During Treatment: Gait belt;Oxygen Activity Tolerance: Patient tolerated treatment well;Patient limited by fatigue;Treatment limited secondary to medical complications (Comment) Patient left: in bed;with call bell/phone within reach;with family/visitor present;with  nursing/sitter in room;Other (comment)(pt on bed pan, restraints left off per RN ) Nurse Communication: Mobility status PT Visit Diagnosis: Muscle weakness (generalized) (M62.81);Unsteadiness on feet (R26.81)     Time: [id number] PT Time Calculation (min) (ACUTE ONLY): 29 min  Charges:  $Therapeutic Exercise: 8-22 mins $Therapeutic Activity: 8-22 mins                     Myles Tavella B. Migdalia Dk PT, DPT Acute Rehabilitation Services Pager [phone] Office [phone]    [place] 09/27/2019, 4:39 PM

## 2019-09-27 NOTE — Progress Notes (Signed)
Peripherally Inserted Central Catheter/Midline Placement  The IV Nurse has discussed with the patient and/or persons authorized to consent for the patient, the purpose of this procedure and the potential benefits and risks involved with this procedure.  The benefits include less needle sticks, lab draws from the catheter, and the patient may be discharged home with the catheter. Risks include, but not limited to, infection, bleeding, blood clot (thrombus formation), and puncture of an artery; nerve damage and irregular heartbeat and possibility to perform a PICC exchange if needed/ordered by physician.  Alternatives to this procedure were also discussed.  Bard Power PICC patient education guide, fact sheet on infection prevention and patient information card has been provided to patient /or left at bedside.    PICC/Midline Placement Documentation  PICC Double Lumen 09/27/19 PICC Right Brachial 41 cm (Active)  Indication for Insertion or Continuance of Line Vasoactive infusions 09/27/19 1116  Exposed Catheter (cm) 0 cm 09/27/19 1116  Site Assessment Clean;Dry;Intact 09/27/19 1116  Lumen #1 Status Flushed;Blood return noted;Saline locked 09/27/19 1116  Lumen #2 Status Flushed;Blood return noted;Saline locked 09/27/19 1116  Dressing Type Transparent;Securing device 09/27/19 1116  Dressing Status Clean;Dry;Intact;Antimicrobial disc in place 09/27/19 1116  Dressing Change Due 10/13/2019 09/27/19 1116       [name] 09/27/2019, 11:20 AM

## 2019-09-27 NOTE — Consult Note (Signed)
NAME:  Jack Mcintyre, MRN:  [id number], DOB:  1956/12/02, LOS: 6 ADMISSION DATE:  09/21/2019, CONSULTATION DATE:  09/26/19 REFERRING MD:  Tawanna Solo- [facility], CHIEF COMPLAINT:  Hypotension  Brief History    Mr. Jack Mcintyre is a 63 y.o. M with PMH significant for throat Ca s/p radiation and trach/PEG in 2015, tobacco and alcohol abuse (drinking 1 pint per day prior to admission), CKD and anemia who was admitted 5 days ago with hypotension, weakness and confusion.   CT chest showed extensive frothy debris in the bilateral dependant airways consistent with extensive aspiration.   He has been treated with Zosyn and labs showed leukopenia and elevated creatinine near baseline.  His BP has been soft since admission SBP 88-100, however today began down-trending into the low 80's to 70's and became hypothermic.  He was given 1 L IVF bolus and PCCM consulted.  Of note, PEG tube became dislodged several months ago and plan is for a cor-trac 10/8.  Pt is not a candidate for an open G-tube per surgery, discussion ongoing as to whether pt is a candidate for PEG replacement given his previous procedures  Past Medical History  Throat Ca, chronic dysphagia and respiratory failure s/p PEG and trach, alcohol and tobacco use, cirrhosis  Significant Hospital Events   10/2 admitted  10/7 - ICU transfer: Hypotensibve after fluid bolus.AMS Weak appearing   Consults:  PCCM Surgery IR  Procedures:  10/3> Paracentesis with 2.2L off  Significant Diagnostic Tests:  10/02 CT chest>>extensive frothy debris within the dependent airways of the lungs bilaterally with ground-glass and clustered nodular airspace disease in the lung bases. Interval enlargement of a somewhat spiculated appearing nodule in the 8 focal right upper lobe, measuring 1.7 x 1.3 cm 10/3 Abdominal US>> No acute findings  Micro Data:  10/2 Sars-CoV-2>>neg 10/3 MRSA screen>>positive 10/3 Respiratory viral panel>>not detected 10/3 Peritoneal fluid gram  stain/culture>>neg 10/3 BCx2>> 10/7 BC x2>>  Antimicrobials:  Unasyn 10/2 only Zosyn 10/3> Vancomycin 10/7>   Interim history/subjective:   10/8 - last night lethargic, hypoglycemic, hypothermic and moved to ICU.  On levophed 1mcg and coming down -> MAP 83. Got albumni last night per RN but still needed pressors.  Post presors - more awake and interactive.  On ATC  Objective   Blood pressure 105/74, pulse 69, temperature 98.4 F (36.9 C), temperature source Oral, resp. rate 16, height 6\' 1"  (1.854 m), weight 74.2 kg, SpO2 100 %.    FiO2 (%):  [28 %-70 %] 60 %   Intake/Output Summary (Last 24 hours) at 09/27/2019 0801 Last data filed at 09/27/2019 0600 Gross per 24 hour  Intake 1746.17 ml  Output 250 ml  Net 1496.17 ml   Filed Weights   09/21/19 2007 09/22/19 0100  Weight: 68 kg 74.2 kg   General Appearance:  Looks deconditioned and lying in bed Head:  Normocephalic, without obvious abnormality, atraumatic Eyes:  PERRL - yes, conjunctiva/corneas - muddy     Ears:  Normal external ear canals, both ears Nose:  G tube - no Throat:  ETT TUBE - no , OG tube - no. TRACH + - site clean Neck:  Supple,  No enlargement/tenderness/nodules Lungs: Clear to auscultation bilaterally, Heart:  S1 and S2 normal, no murmur, CVP - x.  Pressors - levophed 16mcg Abdomen:  Soft, no masses, no organomegaly. DISTENDED with ASCITES Genitalia / Rectal:  Not done Extremities:  Extremities- intact . RUE in bandage due to weeping Skin:  ntact in exposed areas . Sacral  area - not examined Neurologic:  Sedation - none -> RASS - +1 . Moves all 4s - yes. CAM-ICU - neg . Orientation - x3+ - communicating simple stuff to RN via [facility] Problem list   none  Assessment & Plan:   Chronic respiratory failure with airway compromise due to throat cancer -s/p tracheostomy in 2015  09/27/2019 - stable on ATC  P Continue trach Vent supporrt if declines Aggressive suctioning Elevate  HOB 30 degrees, pulm hygiene  Follow tracheal aspirate  Acute Encephalopathy -Sepsis vs hepatic vs delirium vs CNS depressing medications. Favor septic etiology    09/27/2019 - mental status normalized after levophed and BP rise  P Minimize sedating medications as able  Delirium Bundle Sepsis as below  Circulatory shock - presumed septic but PCT < 00.1 x 1 (initial admit aspiration pna)   09/27/2019 - needed levophed despite albumin. Now coming down on levophned  PL:AN -MAP goal > 65; continue levophd  - hydrocort - midodrine   Likely aspiration pneumonia with MRSA PCR + on 09/22/2019   09/27/2019  Was hypothermic. PCT x 0.1 x 2 neg.   PLAN -Vanc/zosyn  -Pan culture -Check PCT -Continue Bair hugger  EtOH abuse - -admitted 10/2 P CIWA protocol with minimal hits (last dose 09/23/2019) -- likely discontinue soon  B vitamins, multivitamin    - -Hx "throat cancer: Hx G tube placement in 2015, dislodged 5-6 months ago - Dysphagia with Aspiration - -aspiration 1-2 weeks before presentation  P -NPO -Not candidate for PEG per IR and Surgery  - place cortrak  CKD III -baseline Cr 1.4-1.9  - 09/27/2019 - creat 1.78  P MAP goal 65 as above Trend renal indices Trend UOP    Electrolyte imbalance  09/27/2019 - low mag  Plan  - replete mag    Hypoglycemia on 09/26/2019  09/27/2019 - doping well on D5 NS at 75cc/h  P -redce D5NS to [facility] and monitor  - hopefully this improves with cortrak and TF  Cirrhosis with ascites  -Child C cirrhotic -EtOH abuse -s/p 2L para on 09/22/19 -culture neg  09/27/2019 - Ammonia 47 on 09/26/2019  P PRN LFT - prophlactic lactulose - place cortrak   Anemia, chronic Thrombocytopenia, chronic  - 09/27/2019 - stable  P No lovenox due to thrombocytopenia, SCD only  - PRBC for hgb </= 6.9gm%    - exceptions are   -  if ACS susepcted/confirmed then transfuse for hgb </= 8.0gm%,  or    -  active bleeding with hemodynamic  instability, then transfuse regardless of hemoglobin value   At at all times try to transfuse 1 unit prbc as possible with exception of active hemorrhage     Right upper lobe Pulmonary Nodule -1.7x1.3cm  -somewhat spiculated -Hx "throat cancer"  P OP follow up if resolves   Failure to thrive -chronic illness with acute on chronic debilitation P -NPO due to dysphagia -Not candidate for G tube with IR or surgery -PT/OT as able - place cortrak  Hypothyroid  Plan  - continue synthroid   Goals of Care: -Palliative has been consulted for severity of chronic illness   Best practice:  Diet: NPO  Pain/Anxiety/Delirium protocol (if indicated): na VAP protocol (if indicated): na DVT prophylaxis: SCD only due to thrombocytopenia  GI prophylaxis: protonix  Glucose control: SSI  Mobility: BR  Code Status: Full   Family Communication: mom updated over the phone from the bedside . PEr mom -  she said she spoke to his kids -> they do not want to prolong anything that is agony for him. She admitted that his outlook is very rough. Goal will be to keep him comfortable as possible . She feels everything has broken down. She said she will visit him during visiting hours. -> based on this mom agreed to No CPR, No ACLS code. No mechanical ventilation.  SHe is unaware of possible palliative meeting time. She does not know what time and is wanting that meeting. She is leaning towards total palliation but wants kids involved in the meeting and is asking for goals of care meeting   Disposition: ICU    ATTESTATION & SIGNATURE   The patient NOHL TRZCINSKI is critically ill with multiple organ systems failure and requires high complexity decision making for assessment and support, frequent evaluation and titration of therapies, application of advanced monitoring technologies and extensive interpretation of multiple databases.   Critical Care Time devoted to patient care services described in this  note is  30  Minutes. This time reflects time of care of this signee Dr Brand Males. This critical care time does not reflect procedure time, or teaching time or supervisory time of PA/NP/Med student/Med Resident etc but could involve care discussion time     Dr. Brand Males, M.D., [facility].C.P Pulmonary and Critical Care Medicine Staff Physician [place] [facility] Pager: [phone], If no answer or between  15:00h - 7:00h: call [phone]  09/27/2019 8:05 AM    LABS    PULMONARY Recent Labs  Lab 09/26/19 1954  PHART 7.299*  PCO2ART 42.2  PO2ART 55.0*  HCO3 21.7  TCO2 23  O2SAT 91.0    CBC Recent Labs  Lab 09/25/19 0352 09/26/19 0357 09/26/19 1954 09/27/19 0231  HGB 10.4* 9.7* 9.9* 9.4*  HCT 30.0* 30.0* 29.0* 28.3*  WBC 3.7* 3.1*  --  3.7*  PLT 78* 72*  --  81*    COAGULATION Recent Labs  Lab 09/21/19 1939  INR 1.1    CARDIAC  No results for input(s): TROPONINI in the last 168 hours. No results for input(s): PROBNP in the last 168 hours.   CHEMISTRY Recent Labs  Lab 09/21/19 1922 09/22/19 1129  09/24/19 1041 09/25/19 0352 09/26/19 0357 09/26/19 1954 09/26/19 2300 09/27/19 0231  NA  --  145   < > 147* 146* 141 142 140 142  K  --  3.2*   < > 3.1* 4.0 3.3* 3.1* 3.3* 3.8  CL  --  109   < > 114* 115* 111  --  112* 115*  CO2  --  27   < > 24 21* 21*  --  20* 20*  GLUCOSE  --  92   < > 86 71 81  --  136* 96  BUN  --  12   < > 12 13 12   --  11 11  CREATININE  --  1.72*   < > 1.73* 1.89* 1.81*  --  1.67* 1.78*  CALCIUM  --  8.7*   < > 8.0* 7.9* 7.8*  --  7.7* 7.8*  MG 1.7 1.8  --  1.7  --   --   --   --  1.4*   < > = values in this interval not displayed.   Estimated Creatinine Clearance: 45.2 mL/min (A) (by C-G formula based on SCr of 1.78 mg/dL (H)).   LIVER Recent Labs  Lab 09/21/19 1759 09/21/19 1939 09/22/19 1129 09/23/19 1359 09/27/19 0231  AST 39  --  31 22 16   ALT 13  --  12 12 9     ALKPHOS 66  --  55 47 38  BILITOT 0.8  --  0.6 0.8 0.6  PROT 6.4*  --  6.4* 5.8* 5.8*  ALBUMIN 2.5*  --  2.5* 2.0* 2.4*  INR  --  1.1  --   --   --      INFECTIOUS Recent Labs  Lab 09/24/19 1358 09/24/19 1626 09/26/19 2300 09/27/19 0231  LATICACIDVEN 1.3 1.4 1.3  --   PROCALCITON  --   --  <0.10 <0.10     ENDOCRINE CBG (last 3)  Recent Labs    09/27/19 0209 09/27/19 0429 09/27/19 0603  GLUCAP 86 82 83         IMAGING x48h  - image(s) personally visualized  -   highlighted in bold Ct Abdomen Wo Contrast  Result Date: 09/26/2019 CLINICAL DATA:  63 year old male with dysphagia EXAM: CT ABDOMEN WITHOUT CONTRAST TECHNIQUE: Multidetector CT imaging of the abdomen was performed following the standard protocol without IV contrast. COMPARISON:  09/03/2015, CT chest 09/21/2019 FINDINGS: Lower chest: Bilateral pleural fluid with scalloping configuration along the margins in a non dependent position bilaterally indicating some complexity. Mixed ground-glass and nodular opacities of the bilateral lung bases with respiratory motion. Differential attenuation of the blood pool and the myocardium. Hepatobiliary: Irregular/nodular surface of liver. Unremarkable gallbladder with no hyperdense material within the lumen. Pancreas: Unremarkable Spleen: Unremarkable Adrenals/Urinary Tract: Adrenal glands not well evaluated. Right kidney is atrophic with density of the medullary region. No hydronephrosis. Left kidney appears somewhat edematous. No hydronephrosis. Nonobstructing stone at the inferior left collecting system of 1 mm-2 mm. Stomach/Bowel: Small hiatal hernia. Stomach is decompressed. There appears to be a gastropexy site with a small gas locule in the soft tissues, continuous with soft tissue in the body wall. This indicates a previous gastrostomy tube. Small bowel decompressed. Small bowel loops in the left abdomen demonstrates circumferential wall thickening. The wall is not well  evaluated given the absence of IV contrast. Partially retained enteric contrast within the colon which is nondistended. Vascular/Lymphatic: Vascular calcifications.  No adenopathy. Other: Low-density ascites throughout the abdomen. Edematous body wall and stranding of the mesentery. Musculoskeletal: No acute displaced fracture identified. Advanced degenerative changes of the spine. No bony canal narrowing. IMPRESSION: Changes of a prior gastrostomy tube with gastropexy site. Fluid positive state with ascites, mesenteric and body wall edema, and bilateral pleural effusions. Configuration of the bilateral pleural effusions suggest some complexity, with background of lung changes which may indicate inflammation/infection as well as chronic changes/atelectasis. Correlation with any chest imaging may be useful. Cirrhotic morphology of the liver. 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 Edematous left kidney with no hydronephrosis. If there is concern for ascending urinary tract infection, recommend correlation with urinalysis. Small bowel demonstrates mild wall thickening, predominantly within the left abdomen. This is nonspecific, and may be sequela of fluid positive state, however, enteritis or ischemia not excluded on the current CT. Evidence of anemia. Aortic Atherosclerosis (ICD10-I70.0). Electronically Signed   By: Corrie Mckusick D.O.   On: 09/26/2019 07:48   Dg Chest Port 1 View  Result Date: 09/26/2019 CLINICAL DATA:  Possible aspiration EXAM: PORTABLE CHEST 1 VIEW COMPARISON:  09/26/2019 FINDINGS: Tracheostomy tube is again noted. Cardiac shadow is stable. Right pleural thickening is again noted and stable as is similar  findings on the left but to a lesser degree. Patchy airspace opacity is noted throughout the right lung similar to that seen on the prior exam. Bullous changes are again seen in the apices. IMPRESSION: Stable appearance of the chest when compared with the prior exam with  persistent airspace opacity primarily within the right lung. Electronically Signed   By: Inez Catalina M.D.   On: 09/26/2019 20:08   Dg Chest Port 1 View  Result Date: 09/26/2019 CLINICAL DATA:  Aspiration pneumonia. EXAM: PORTABLE CHEST 1 VIEW COMPARISON:  10-20 FINDINGS: 1203 hours. The cardio pericardial silhouette is enlarged. Tracheostomy tube again noted. Parenchymal scarring and bullous change evident in the upper lungs. Diffuse interstitial opacity is similar. Interval development of right base airspace disease and right pleural effusion, potentially loculated. Bones are diffusely demineralized. Insert wire IMPRESSION: Interval development of right base airspace opacity with right pleural effusion, potentially loculated. Electronically Signed   By: Misty Stanley M.D.   On: 09/26/2019 14:05   [place] Ekg Site Rite  Result Date: 09/26/2019 If Site Rite image not attached, placement could not be confirmed due to current cardiac rhythm.

## 2019-09-27 NOTE — Progress Notes (Signed)
   PM rounds  -per RN palliative care meeting with family tomorrow 09/28/19 -time not known - currently stable and off pressors  - cortrak pending - no cpr, no vent, but ok for presors. Mom leaning towards palliation - consult pending  Plan  - move to progressive  - pccm off and trh priamary from 09/28/19 - d/w Dr Anell Barr    Dr. Brand Males, M.D., F.C.C.P,  Pulmonary and Critical Care Medicine Staff Physician, [place] Director - Interstitial Lung Disease  Program  Pulmonary [place] [facility] [place], [place], [id number]  Pager: [phone], If no answer or between  15:00h - 7:00h: call [phone] Telephone: [phone]  5:35 PM 09/27/2019

## 2019-09-27 NOTE — Progress Notes (Signed)
Responded to Palliative care consult for spiritual care. Nurse was tending Pt. And he was awake with no family at this time. I was pastorially present for compassionate listening. Jack Mcintyre mentioned his emotional struggle with his life and the pains he put on his family with his drinking as far back as his teenage ears drinking with his father. Jack Mcintyre said that he is not in pain, but emotional. He requested I pray with him. 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   His family is supposed to visit around 10am today. However, I will follow up with [facility] tomorrow.  Chaplain Resident  Fidel Levy  [phone]

## 2019-09-27 NOTE — Progress Notes (Addendum)
After assessing piv to right arm often, noted that bed was found wet, piv was out of arm, arm noted to be swollen.  Pressure dressing applied to right arm from fingers to above elbow, bp cuff reapplied.  Good cap refill noted, good radial pulses and brachial pulses palpated.  Patient cleaned up and all linen changed.  Left Midline remains only access.  Restraint reapplied loosely for comfort and functionality

## 2019-09-27 NOTE — Progress Notes (Signed)
Patient ID: Jack Mcintyre, male   DOB: 03-13-1956, 63 y.o.   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   From a surgical standpoint, we have nothing further to offer. We can reassess if his liver disease ever improves.

## 2019-09-28 DIAGNOSIS — Z7189 Other specified counseling: Secondary | ICD-10-CM

## 2019-09-28 DIAGNOSIS — R1310 Dysphagia, unspecified: Secondary | ICD-10-CM

## 2019-09-28 DIAGNOSIS — Z515 Encounter for palliative care: Secondary | ICD-10-CM

## 2019-09-28 DIAGNOSIS — K7031 Alcoholic cirrhosis of liver with ascites: Secondary | ICD-10-CM

## 2019-09-28 LAB — CBC WITH DIFFERENTIAL/PLATELET
Abs Immature Granulocytes: 0.03 10*3/uL (ref 0.00–0.07)
Basophils Absolute: 0 10*3/uL (ref 0.0–0.1)
Basophils Relative: 0 %
Eosinophils Absolute: 0 10*3/uL (ref 0.0–0.5)
Eosinophils Relative: 0 %
HCT: 30.2 % — ABNORMAL LOW (ref 39.0–52.0)
Hemoglobin: 10.2 g/dL — ABNORMAL LOW (ref 13.0–17.0)
Immature Granulocytes: 0 %
Lymphocytes Relative: 5 %
Lymphs Abs: 0.4 10*3/uL — ABNORMAL LOW (ref 0.7–4.0)
MCH: 33.3 pg (ref 26.0–34.0)
MCHC: 33.8 g/dL (ref 30.0–36.0)
MCV: 98.7 fL (ref 80.0–100.0)
Monocytes Absolute: 0.1 10*3/uL (ref 0.1–1.0)
Monocytes Relative: 2 %
Neutro Abs: 7.4 10*3/uL (ref 1.7–7.7)
Neutrophils Relative %: 93 %
Platelets: 87 10*3/uL — ABNORMAL LOW (ref 150–400)
RBC: 3.06 MIL/uL — ABNORMAL LOW (ref 4.22–5.81)
RDW: 17.2 % — ABNORMAL HIGH (ref 11.5–15.5)
WBC: 7.9 10*3/uL (ref 4.0–10.5)
nRBC: 0 % (ref 0.0–0.2)

## 2019-09-28 LAB — MAGNESIUM: Magnesium: 2.1 mg/dL (ref 1.7–2.4)

## 2019-09-28 LAB — PROCALCITONIN: Procalcitonin: <0.1 ng/mL

## 2019-09-28 LAB — GLUCOSE, CAPILLARY
Glucose-Capillary: 77 mg/dL (ref 70–99)
Glucose-Capillary: 78 mg/dL (ref 70–99)
Glucose-Capillary: 81 mg/dL (ref 70–99)
Glucose-Capillary: 86 mg/dL (ref 70–99)

## 2019-09-28 MED ORDER — MORPHINE SULFATE (CONCENTRATE) 10 MG/0.5ML PO SOLN: 5.0000 mg | ORAL | Status: DC | PRN

## 2019-09-28 MED ORDER — ATROPINE SULFATE 1 % OP SOLN
4.0000 [drp] | OPHTHALMIC | Status: DC | PRN
  Administered 2019-09-28: 22:00:00 4 [drp] via SUBLINGUAL
  Filled 2019-09-28: qty 2

## 2019-09-28 MED ORDER — LORAZEPAM 1 MG PO TABS: 1.0000 mg | ORAL_TABLET | ORAL | Status: DC | PRN

## 2019-09-28 MED ORDER — LORAZEPAM 2 MG/ML IJ SOLN
1.0000 mg | INTRAMUSCULAR | Status: DC | PRN
  Administered 2019-09-29: 01:00:00 1 mg via INTRAVENOUS
  Filled 2019-09-28: qty 1

## 2019-09-28 MED ORDER — INFLUENZA VAC SPLIT QUAD 0.5 ML IM SUSY
0.5000 mL | PREFILLED_SYRINGE | INTRAMUSCULAR | Status: AC
  Administered 2019-09-29: 09:00:00 0.5 mL via INTRAMUSCULAR
  Filled 2019-09-28: qty 0.5

## 2019-09-28 MED ORDER — LORAZEPAM 2 MG/ML PO CONC: 1.0000 mg | ORAL | Status: DC | PRN

## 2019-09-28 NOTE — Consult Note (Signed)
Consultation Note Date: 09/28/2019   Patient Name: Jack Mcintyre  DOB: 01/22/1956  MRN: [id number]  Age / Sex: 63 y.o., male  PCP: [facility] Referring Physician: Shelly Coss, MD  Reason for Consultation: Establishing goals of care  HPI/Patient Profile: 63 y.o. male  with past medical history of throat cancer, s/p radiation, chemo, trach, PEG, CKD IV, cirrhosis, ETOH abuse, chronic anemia . He pulled his PEG out several months ago and it was determined not to be replaced.  He was admitted on 09/21/2019 with aspiration pneumonia. He was recently discharged from the [facility] hospital for the same diagnosis. GI and IR were consulted for PEG placement, however, this interventions was not offered due to patient's advanced cirrhosis- CHILD C. Palliative consulted for further GOC.   Clinical Assessment and Goals of Care:  I have reviewed medical records including EPIC notes, labs and imaging, assessed the patient and then met at the bedside along with patient's mother, two sons, and daughter- to discuss diagnosis prognosis, GOC, EOL wishes, disposition and options.  I introduced Palliative Medicine as specialized medical care for people living with serious illness. It focuses on providing relief from the symptoms and stress of a serious illness. The goal is to improve quality of life for both the patient and the family.  Patient was somewhat oriented- but cognitively slowed. He had some but limited insight into his health status. His mother notes that he often has issues with his memory affecting his decision making. Jack Mcintyre discussion was carried out in his presence, with his input- but at times he was distracted and lethargic.   We discussed a brief life review of the patient. Family describes him as a good father, raised his sons to respect their mother and their wives- Jack Mcintyre's wife died several years ago.    As far as functional and nutritional status- there has been significant ongoing decline over the last several months. Prior to this admission- patient was unable to ambulate in the home after his discharge from the [facility] hospital.   We discussed his current illness and what it means in the larger context of his on-going co-morbidities.  Natural disease trajectory and expectations at EOL were discussed.  Patient was surprised to hear that he was likely at EOL - however, all family members present were not- all gave patient support as he grieved. Patient stated that spending time with his family is most important. He would rather be able to eat and assume risks of aspiration rather than proceed with feeding tube or cortrak.   The difference between aggressive medical intervention and comfort care was considered in light of the patient's goals of care. Patient and family decided they would like to proceed with comfort care measures.   Advanced directives, concepts specific to code status, artifical feeding and hydration, and rehospitalization were considered and discussed.  Hospice and Palliative Care services outpatient were explained and offered. Family requests placement at [facility] residential hospice in [place].  Questions and concerns were addressed.  Hard Choices  booklet left for review. The family was encouraged to call with questions or concerns.    Primary Decision Maker NEXT OF KIN- patient's mother- Jack Mcintyre- with support of his children- Jack Mcintyre, and Jack Nip, [name]     SUMMARY OF RECOMMENDATIONS -No cortrak -Regular diet ordered with aspiration precautions and comfort feeding recommended -GOC - transitioned to comfort care- d/c antibiotics, IV fluids, comfort medications as ordered -Referral to social work for residential hospice request    Code Status/Advance Care Planning:  DNR  Palliative Prophylaxis:   Aspiration, Delirium Protocol and Frequent  Pain Assessment  Additional Recommendations (Limitations, Scope, Preferences):  Full Comfort Care  Prognosis:    < 2 weeks due to recurrent aspiration pneumonia, transition to comfort measures only, allow for comfort feedings  Discharge Planning: Hospice facility  Primary Diagnoses: Present on Admission: . Aspiration pneumonia ([place]) . Acute kidney injury ([place]) . Malnutrition of moderate degree . Alcohol abuse . Hypokalemia . Hypoglycemia . Anemia . Lung nodule   I have reviewed the medical record, interviewed the patient and family, and examined the patient. The following aspects are pertinent.  Past Medical History:  Diagnosis Date  . Cancer [facility])    Social History   Socioeconomic History  . Marital status: Single    Spouse name: Not on file  . Number of children: Not on file  . Years of education: Not on file  . Highest education level: Not on file  Occupational History  . Not on file  Social Needs  . Financial resource strain: Not on file  . Food insecurity    Worry: Not on file    Inability: Not on file  . Transportation needs    Medical: Not on file    Non-medical: Not on file  Tobacco Use  . Smoking status: Current Every Day Smoker    Packs/day: 0.50    Years: 20.00    Pack years: 10.00    Types: Cigarettes  . Smokeless tobacco: Never Used  . Tobacco comment: uses vape when out of cigarettes  Substance and Sexual Activity  . Alcohol use: Yes    Comment: daily about a pint  . Drug use: Not on file  . Sexual activity: Not on file  Lifestyle  . Physical activity    Days per week: Not on file    Minutes per session: Not on file  . Stress: Not on file  Relationships  . Social Herbalist on phone: Not on file    Gets together: Not on file    Attends religious service: Not on file    Active member of club or organization: Not on file    Attends meetings of clubs or organizations: Not on file    Relationship status: Not on file  Other  Topics Concern  . Not on file  Social History Narrative  . Not on file   Family History  Problem Relation Age of Onset  . Hypertension Other    Scheduled Meds: . Chlorhexidine Gluconate Cloth  6 each Topical Daily  . hydrocortisone sod succinate (SOLU-CORTEF) inj  50 mg Intravenous Q6H  . [START ON 09/29/2019] influenza vac split quadrivalent PF  0.5 mL Intramuscular Tomorrow-1000  . levothyroxine  100 mcg Oral Q0600  . midodrine  5 mg Oral BID WC  . mupirocin ointment  1 application Nasal BID  . nicotine  14 mg Transdermal Daily  . Oxcarbazepine  450 mg Oral QHS  . tamsulosin  0.4  mg Oral QHS  . terbinafine   Topical BID   Continuous Infusions: . piperacillin-tazobactam (ZOSYN)  IV 3.375 g (09/28/19 1838)  . sodium chloride     PRN Meds:.atropine, LORazepam **OR** LORazepam **OR** LORazepam, morphine CONCENTRATE **OR** morphine CONCENTRATE, ondansetron **OR** ondansetron (ZOFRAN) IV Medications Prior to Admission:  Prior to Admission medications   Medication Sig Start Date End Date Taking? Authorizing Provider  amoxicillin-clavulanate (AUGMENTIN) 875-125 MG tablet Take 1 tablet by mouth 2 (two) times daily. 09/20/19  Yes [provider]  levothyroxine (SYNTHROID) 100 MCG tablet Take 100 mcg by mouth daily before breakfast.    Yes [provider]  midodrine (PROAMATINE) 5 MG tablet Take 5 mg by mouth 2 (two) times daily with a meal.   Yes [provider]  atorvastatin (LIPITOR) 10 MG tablet Take 10 mg by mouth daily.    [provider]  furosemide (LASIX) 40 MG tablet Take 40 mg by mouth 2 (two) times daily.    [provider]  Oxcarbazepine (TRILEPTAL) 300 MG tablet Take 450 mg by mouth at bedtime.    [provider]  tamsulosin (FLOMAX) 0.4 MG CAPS capsule Take 0.4 mg by mouth at bedtime.    [provider]   No Known Allergies Review of Systems  Unable to perform ROS: Mental status change    Physical Exam  Vitals signs and nursing note reviewed.  Constitutional:      Comments: cachetic  Cardiovascular:     Comments: Diffuse anasarca Abdominal:     Comments: ascites  Musculoskeletal:     Right lower leg: Edema present.     Left lower leg: Edema present.  Neurological:     Comments: Cognition slow, limited insight     Vital Signs: BP 94/73   Pulse 78   Temp (!) 96.6 F (35.9 C) (Rectal)   Resp 16   Ht 6' 1" (1.854 m)   Wt 74.2 kg   SpO2 100%   BMI 21.58 kg/m  Pain Scale: 0-10   Pain Score: 0-No pain   SpO2: SpO2: 100 % O2 Device:SpO2: 100 % O2 Flow Rate: .O2 Flow Rate (L/min): 5 L/min  IO: Intake/output summary:   Intake/Output Summary (Last 24 hours) at 09/28/2019 2025 Last data filed at 09/28/2019 1800 Gross per 24 hour  Intake 369.87 ml  Output 50 ml  Net 319.87 ml    LBM: Last BM Date: 09/28/19 Baseline Weight: Weight: 68 kg Most recent weight: Weight: 74.2 kg     Palliative Assessment/Data: PPS: 10%     Thank you for this consult. Palliative medicine will continue to follow and assist as needed.   Time In: 1400, 1630 Time Out: 1530, 1700 Time Total: 120 min Greater than 50%  of this time was spent counseling and coordinating care related to the above assessment and plan.  Signed by: Mariana Kaufman, AGNP-C Palliative Medicine    Please contact Palliative Medicine Team phone at [phone] for questions and concerns.  For individual provider: See Shea Evans

## 2019-09-28 NOTE — Progress Notes (Addendum)
PROGRESS NOTE    Jack Mcintyre  [id number] DOB: 01/08/1956 DOA: 09/21/2019 PCP: [facility]   Brief Narrative:  Patient is a 63 year old male with history of throat  cancer status post radiation treatment, status post trach and PEG in 2015, chronic cough, history of tobacco/alcohol abuse, CKD stage III, chronic anemia who presents here with concern for declining health, weakness, confusion and shortness of breath.  He was noted to be hypotensive on arrival.  He failed bedside swallowing evaluation and was found to have aspiration pneumonia patient with chest x-ray and CT finding.  He was started on broad spectrum antibiotics for aspiration pneumonia. Covid -19  was negative.  SLP was consulted.  MBS was done and was found to have moderate aspiration risks and recommended n.p.o. IR/general surgery consulted for PEG are G-tube placement but declined week saying he is not a candidate.  On 09/26/2019 he became hypotensive and hypothermic so he was transferred to ICU.  Patient transferred back to [facility] service on 09/29/2019.  Palliative team meeting with family for goals of care discussion/possible hospice.  At the meantime I have  requested core track team to put feeding tube for feeding .  Assessment & Plan:   Active Problems:   Acute kidney injury (HCC)   Malnutrition of moderate degree   Aspiration pneumonia (HCC)   Alcohol abuse   Hypokalemia   Hypoglycemia   Anemia   Lung nodule   Recurrent aspiration pneumonia/dysphagia: Speech was following.  MBS was done.  Has moderate aspiration risk.  Recommend n.p.o.  IR consulted for PEG tube placement.But due to ascites ,IR cannot place PEG tube.  General surgery also declined putting PEG or G-tube saying he is not a candidate from surgical standpoint. Currently on Zosyn for aspiration pneumonia. Requested core track team to put feeding tube and start feeding until we get clear decision from family .  He has not been fed for last for  4-5 days  History of throat cancer: Status post radiation treatment.  On trach.  Hypotension: Blood pressure continues to remain low in the upper 80s.  As per the family, he has chronic hypotension.  He required pressors in ICU but it has been on hold now.  CKD stage III: Baseline creatinine 1.4-1.9 .currently kidney function on baseline.  Continue to monitor  Chronic alcohol abuse: History of drinking 1 pint of liquor every day.  Continue to monitor for withdrawal.  Chronic anemia: Most likely associated with chronic comorbidities/medical issues,chronic alcohol abuse.  He was transfused with a unit of  PRBC on 09/21/2019.  Currently hemoglobin stable.  Lung nodule: Known finding.  CT chest showed Interval enlargement of a somewhat spiculated appearing nodule in the 8 focal right upper lobe, measuring 1.7 x 1.3 cm, previously 1.3 x 0.6 cm . Concerning for malignany.Outpatient PET CT recommended.Follow-up with pulmonology as an outpatient if he is discharged.  Anasarca/ascites: Due to poor oral intake/hypoalbuminemia or liver cirrhosis.  Irregular/nodular surface of liver as per CT abdomen.Status post 2 L of paracentesis on 09/22/2019.  Culture negative.  No abdominal pain or tenderness on exam.  Thrombocytopenia: Chronic.  Associated  with chronic alcohol abuse.  Continue to monitor.   Nutrition Problem: Inadequate oral intake Etiology: dysphagia, cancer and cancer related treatments(throat cancer)      DVT prophylaxis: Lovenox Code Status: Full Family Communication: None Disposition Plan: Undetermined at this point   Consultants: general surgery  Procedures:None  Antimicrobials:  Anti-infectives (From admission, onward)   Start  Dose/Rate Route Frequency Ordered Stop   09/27/19 1900  vancomycin (VANCOCIN) IVPB 1000 mg/200 mL premix     1,000 mg 200 mL/hr over 60 Minutes Intravenous Every 24 hours 09/26/19 1859     09/26/19 1900  vancomycin (VANCOCIN) 1,500 mg in sodium  chloride 0.9 % 500 mL IVPB     1,500 mg 250 mL/hr over 120 Minutes Intravenous  Once 09/26/19 1859 09/27/19 0129   09/22/19 0800  piperacillin-tazobactam (ZOSYN) IVPB 3.375 g     3.375 g 12.5 mL/hr over 240 Minutes Intravenous Every 8 hours 09/22/19 0112     09/21/19 2145  Ampicillin-Sulbactam (UNASYN) 3 g in sodium chloride 0.9 % 100 mL IVPB     3 g 200 mL/hr over 30 Minutes Intravenous  Once 09/21/19 2139 09/22/19 0020      Subjective: Patient seen and examined at bedside this morning.  Still hypotensive.  Blood pressure in the range of 80s.  Alert, awake and oriented.  Extremely deconditioned, debilitated.  Objective: Vitals:   09/28/19 1050 09/28/19 1100 09/28/19 1125 09/28/19 1200  BP: (!) 90/57 91/65  (!) 92/57  Pulse: 73 71 85 76  Resp: 15 14 16 15   Temp: (!) 96.6 F (35.9 C)     TempSrc: Rectal     SpO2: 100% 100% 98% 100%  Weight:      Height:        Intake/Output Summary (Last 24 hours) at 09/28/2019 1255 Last data filed at 09/28/2019 1200 Gross per 24 hour  Intake 586.17 ml  Output 180 ml  Net 406.17 ml   Filed Weights   09/21/19 2007 09/22/19 0100  Weight: 68 kg 74.2 kg    Examination:  General exam: Not in distress, extremely deconditioned/debilitated  HEENT: Trach with some secretions. Respiratory system: Bilateral decreased air entry on bases but no wheezes or crackles on auscultation on anterior chest. Cardiovascular system: S1 & S2 heard, RRR. No JVD, murmurs, rubs, gallops or clicks. No pedal edema. Gastrointestinal system: Abdomen is mildly distended, soft and nontender. No organomegaly or masses felt. Normal bowel sounds heard. Central nervous system: Alert and awake Extremities: No edema, no clubbing ,no cyanosis, distal peripheral pulses palpable. Skin: No rashes, lesions or ulcers,no icterus ,no pallor   Data Reviewed: I have personally reviewed following labs and imaging studies  CBC: Recent Labs  Lab 09/24/19 1041 09/25/19 0352  09/26/19 0357 09/26/19 1954 09/27/19 0231 09/28/19 0245  WBC 3.0* 3.7* 3.1*  --  3.7* 7.9  NEUTROABS 2.1 2.4 2.0  --  3.1 7.4  HGB 9.6* 10.4* 9.7* 9.9* 9.4* 10.2*  HCT 29.6* 30.0* 30.0* 29.0* 28.3* 30.2*  MCV 100.3* 96.2 100.7*  --  99.0 98.7  PLT 85* 78* 72*  --  81* 87*   Basic Metabolic Panel: Recent Labs  Lab 09/21/19 1922 09/22/19 1129  09/24/19 1041 09/25/19 0352 09/26/19 0357 09/26/19 1954 09/26/19 2300 09/27/19 0231 09/28/19 0245  NA  --  145   < > 147* 146* 141 142 140 142  --   K  --  3.2*   < > 3.1* 4.0 3.3* 3.1* 3.3* 3.8  --   CL  --  109   < > 114* 115* 111  --  112* 115*  --   CO2  --  27   < > 24 21* 21*  --  20* 20*  --   GLUCOSE  --  92   < > 86 71 81  --  136* 96  --  BUN  --  12   < > 12 13 12   --  11 11  --   CREATININE  --  1.72*   < > 1.73* 1.89* 1.81*  --  1.67* 1.78*  --   CALCIUM  --  8.7*   < > 8.0* 7.9* 7.8*  --  7.7* 7.8*  --   MG 1.7 1.8  --  1.7  --   --   --   --  1.4* 2.1   < > = values in this interval not displayed.   GFR: Estimated Creatinine Clearance: 45.2 mL/min (A) (by C-G formula based on SCr of 1.78 mg/dL (H)). Liver Function Tests: Recent Labs  Lab 09/21/19 1759 09/22/19 1129 09/23/19 1359 09/27/19 0231  AST 39 31 22 16   ALT 13 12 12 9   ALKPHOS 66 55 47 38  BILITOT 0.8 0.6 0.8 0.6  PROT 6.4* 6.4* 5.8* 5.8*  ALBUMIN 2.5* 2.5* 2.0* 2.4*   Recent Labs  Lab 09/21/19 2045  LIPASE 36   Recent Labs  Lab 09/21/19 2040 09/26/19 2300  AMMONIA 17 47*   Coagulation Profile: Recent Labs  Lab 09/21/19 1939  INR 1.1   Cardiac Enzymes: No results for input(s): CKTOTAL, CKMB, CKMBINDEX, TROPONINI in the last 168 hours. BNP (last 3 results) No results for input(s): PROBNP in the last 8760 hours. HbA1C: No results for input(s): HGBA1C in the last 72 hours. CBG: Recent Labs  Lab 09/27/19 1958 09/27/19 2353 09/28/19 0416 09/28/19 0717 09/28/19 1117  GLUCAP 106* 86 77 78 81   Lipid Profile: No results for  input(s): CHOL, HDL, LDLCALC, TRIG, CHOLHDL, LDLDIRECT in the last 72 hours. Thyroid Function Tests: No results for input(s): TSH, T4TOTAL, FREET4, T3FREE, THYROIDAB in the last 72 hours. Anemia Panel: No results for input(s): VITAMINB12, FOLATE, FERRITIN, TIBC, IRON, RETICCTPCT in the last 72 hours. Sepsis Labs: Recent Labs  Lab 09/21/19 2040 09/24/19 1358 09/24/19 1626 09/26/19 2300 09/27/19 0231 09/28/19 0245  PROCALCITON  --   --   --  <0.10 <0.10 <0.10  LATICACIDVEN 0.9 1.3 1.4 1.3  --   --     Recent Results (from the past 240 hour(s))  SARS Coronavirus 2 [facility] order, Performed in [facility] hospital lab) Nasopharyngeal Nasopharyngeal Swab     Status: None   Collection Time: 09/21/19  8:10 PM   Specimen: Nasopharyngeal Swab  Result Value Ref Range Status   SARS Coronavirus 2 NEGATIVE NEGATIVE Final    Comment: (NOTE) If result is NEGATIVE SARS-CoV-2 target nucleic acids are NOT DETECTED. The SARS-CoV-2 RNA is generally detectable in upper and lower  respiratory specimens during the acute phase of infection. The lowest  concentration of SARS-CoV-2 viral copies this assay can detect is 250  copies / mL. A negative result does not preclude SARS-CoV-2 infection  and should not be used as the sole basis for treatment or other  patient management decisions.  A negative result may occur with  improper specimen collection / handling, submission of specimen other  than nasopharyngeal swab, presence of viral mutation(s) within the  areas targeted by this assay, and inadequate number of viral copies  (<250 copies / mL). A negative result must be combined with clinical  observations, patient history, and epidemiological information. If result is POSITIVE SARS-CoV-2 target nucleic acids are DETECTED. The SARS-CoV-2 RNA is generally detectable in upper and lower  respiratory specimens dur ing the acute phase of infection.  Positive  results are indicative of active infection  with SARS-CoV-2.  Clinical  correlation with patient history and other diagnostic information is  necessary to determine patient infection status.  Positive results do  not rule out bacterial infection or co-infection with other viruses. If result is PRESUMPTIVE POSTIVE SARS-CoV-2 nucleic acids MAY BE PRESENT.   A presumptive positive result was obtained on the submitted specimen  and confirmed on repeat testing.  While 2019 novel coronavirus  (SARS-CoV-2) nucleic acids may be present in the submitted sample  additional confirmatory testing may be necessary for epidemiological  and / or clinical management purposes  to differentiate between  SARS-CoV-2 and other Sarbecovirus currently known to infect humans.  If clinically indicated additional testing with an alternate test  methodology [phone]) is advised. The SARS-CoV-2 RNA is generally  detectable in upper and lower respiratory sp ecimens during the acute  phase of infection. The expected result is Negative. Fact Sheet for Patients:  StrictlyIdeas.no Fact Sheet for Healthcare Providers: BankingDealers.co.za This test is not yet approved or cleared by the [place] FDA and has been authorized for detection and/or diagnosis of SARS-CoV-2 by FDA under an Emergency Use Authorization (EUA).  This EUA will remain in effect (meaning this test can be used) for the duration of the COVID-19 declaration under Section 564(b)(1) of the Act, 21 U.S.C. section 360bbb-3(b)(1), unless the authorization is terminated or revoked sooner. Performed at [facility], [place] [address], [place], [place] [address]   MRSA PCR Screening     Status: Abnormal   Collection Time: 09/22/19  1:55 AM   Specimen: Nasal Mucosa; Nasopharyngeal  Result Value Ref Range Status   MRSA by PCR POSITIVE (A) NEGATIVE Final    Comment:        The GeneXpert MRSA Assay (FDA approved for NASAL specimens only), is one  component of a comprehensive MRSA colonization surveillance program. It is not intended to diagnose MRSA infection nor to guide or monitor treatment for MRSA infections. RESULT CALLED TO, READ BACK BY AND VERIFIED WITHPricilla Larsson RN 09/22/19 0420 JDW Performed at [place] [address], [place], [place] [address]   Culture, blood (routine x 2)     Status: None   Collection Time: 09/22/19 11:58 AM   Specimen: BLOOD RIGHT ARM  Result Value Ref Range Status   Specimen Description BLOOD RIGHT ARM  Final   Special Requests   Final    BOTTLES DRAWN AEROBIC AND ANAEROBIC Blood Culture adequate volume   Culture   Final    NO GROWTH 5 DAYS Performed at [facility], [address], [place], [place] [address]    Report Status 09/27/2019 FINAL  Final  Culture, blood (routine x 2)     Status: None   Collection Time: 09/22/19 11:58 AM   Specimen: BLOOD RIGHT ARM  Result Value Ref Range Status   Specimen Description BLOOD RIGHT ARM  Final   Special Requests   Final    BOTTLES DRAWN AEROBIC AND ANAEROBIC Blood Culture adequate volume   Culture   Final    NO GROWTH 5 DAYS Performed at [facility], [place] [address], [place], [place] [address]    Report Status 09/27/2019 FINAL  Final  Respiratory Panel by PCR     Status: None   Collection Time: 09/22/19 12:33 PM   Specimen: Nasopharyngeal Swab; Respiratory  Result Value Ref Range Status   Adenovirus NOT DETECTED NOT DETECTED Final   Coronavirus 229E NOT DETECTED NOT DETECTED Final  Comment: (NOTE) The Coronavirus on the Respiratory Panel, DOES NOT test for the novel  Coronavirus (2019 nCoV)    Coronavirus HKU1 NOT DETECTED NOT DETECTED Final   Coronavirus NL63 NOT DETECTED NOT DETECTED Final   Coronavirus OC43 NOT DETECTED NOT DETECTED Final   Metapneumovirus NOT DETECTED NOT DETECTED Final   Rhinovirus / Enterovirus NOT DETECTED NOT DETECTED Final   Influenza A NOT DETECTED NOT DETECTED Final   Influenza B NOT  DETECTED NOT DETECTED Final   Parainfluenza Virus 1 NOT DETECTED NOT DETECTED Final   Parainfluenza Virus 2 NOT DETECTED NOT DETECTED Final   Parainfluenza Virus 3 NOT DETECTED NOT DETECTED Final   Parainfluenza Virus 4 NOT DETECTED NOT DETECTED Final   Respiratory Syncytial Virus NOT DETECTED NOT DETECTED Final   Bordetella pertussis NOT DETECTED NOT DETECTED Final   Chlamydophila pneumoniae NOT DETECTED NOT DETECTED Final   Mycoplasma pneumoniae NOT DETECTED NOT DETECTED Final    Comment: Performed at [facility], [place] [address], [place], [place] [address]  Gram stain     Status: None   Collection Time: 09/22/19  3:35 PM   Specimen: PATH Cytology Peritoneal fluid  Result Value Ref Range Status   Specimen Description PERITONEAL  Final   Special Requests NONE  Final   Gram Stain   Final    NO WBC SEEN NO ORGANISMS SEEN Performed at [facility], [address] [place], [place] [address]    Report Status 09/22/2019 FINAL  Final  Culture, body fluid-bottle     Status: None   Collection Time: 09/22/19  3:35 PM   Specimen: Peritoneal Washings  Result Value Ref Range Status   Specimen Description PERITONEAL  Final   Special Requests NONE  Final   Culture   Final    NO GROWTH 5 DAYS Performed at [facility], [address], [place], [place] [address]    Report Status 09/27/2019 FINAL  Final  Culture, blood (routine x 2)     Status: None (Preliminary result)   Collection Time: 09/26/19 11:23 PM   Specimen: BLOOD RIGHT HAND  Result Value Ref Range Status   Specimen Description BLOOD RIGHT HAND  Final   Special Requests   Final    BOTTLES DRAWN AEROBIC AND ANAEROBIC Blood Culture adequate volume   Culture   Final    NO GROWTH 1 DAY Performed at [facility], [place] [address], [place], [place] [address]    Report Status PENDING  Incomplete  Culture, blood (routine x 2)     Status: None (Preliminary result)   Collection Time: 09/26/19 11:23 PM   Specimen:  BLOOD LEFT HAND  Result Value Ref Range Status   Specimen Description BLOOD LEFT HAND  Final   Special Requests   Final    BOTTLES DRAWN AEROBIC AND ANAEROBIC Blood Culture adequate volume   Culture   Final    NO GROWTH 1 DAY Performed at [facility], [place] [address], [place], [place] [address]    Report Status PENDING  Incomplete         Radiology Studies: Dg Chest Port 1 View  Result Date: 09/26/2019 CLINICAL DATA:  Possible aspiration EXAM: PORTABLE CHEST 1 VIEW COMPARISON:  09/26/2019 FINDINGS: Tracheostomy tube is again noted. Cardiac shadow is stable. Right pleural thickening is again noted and stable as is similar findings on the left but to a lesser degree. Patchy airspace opacity is noted throughout the right lung similar  to that seen on the prior exam. Bullous changes are again seen in the apices. IMPRESSION: Stable appearance of the chest when compared with the prior exam with persistent airspace opacity primarily within the right lung. Electronically Signed   By: Inez Catalina M.D.   On: 09/26/2019 20:08   [place] Ekg Site Rite  Result Date: 09/26/2019 If Site Rite image not attached, placement could not be confirmed due to current cardiac rhythm.       Scheduled Meds: . atorvastatin  10 mg Oral Daily  . Chlorhexidine Gluconate Cloth  6 each Topical Daily  . folic acid  1 mg Oral Daily  . hydrocortisone sod succinate (SOLU-CORTEF) inj  50 mg Intravenous Q6H  . [START ON 09/29/2019] influenza vac split quadrivalent PF  0.5 mL Intramuscular Tomorrow-1000  . levothyroxine  100 mcg Oral Q0600  . midodrine  5 mg Oral BID WC  . multivitamin with minerals  1 tablet Oral Daily  . mupirocin ointment  1 application Nasal BID  . nicotine  14 mg Transdermal Daily  . Oxcarbazepine  450 mg Oral QHS  . sodium chloride flush  10-40 mL Intracatheter Q12H  . sodium chloride flush  10-40 mL Intracatheter Q12H  . sodium chloride flush  3 mL Intravenous Once  . tamsulosin  0.4 mg  Oral QHS  . terbinafine   Topical BID  . thiamine  100 mg Oral Daily   Or  . thiamine  100 mg Intravenous Daily   Continuous Infusions: . dextrose 5 % and 0.9% NaCl 10 mL/hr (09/27/19 0949)  . piperacillin-tazobactam (ZOSYN)  IV Stopped (09/28/19 1158)  . sodium chloride    . vancomycin Stopped (09/27/19 1927)     LOS: 7 days    Time spent: 35 mins.More than 50% of that time was spent in counseling and/or coordination of care.      Shelly Coss, MD [facility] Pager [phone]  If 7PM-7AM, please contact night-coverage [email or web address] Password [email or web address] 09/28/2019, 12:55 PM

## 2019-09-28 NOTE — Progress Notes (Signed)
Initial note- full consult to follow-  Patient and family have decided to transition to comfort care and request placement at [facility]. Patient would like to proceed with comfort feedings, no cortrak. Accepts risk of aspiration.   Jack Mcintyre, AGNP-C Palliative Medicine  Please call Palliative Medicine team phone with any questions [phone]. For individual providers please see AMION.  No charge note

## 2019-09-28 NOTE — Progress Notes (Signed)
Cortrak Tube Team Note:  Consult received to place a Cortrak feeding tube.   Cortrak team arrived on unit. Discussed with RN and patient is refusing tube placement. Please re-consult Cortrak team as needed.  Willey Blade, MS, [place], LDN Office: [phone] Pager: [phone] After Hours/Weekend Pager: [phone]

## 2019-09-28 NOTE — Progress Notes (Signed)
Palliative consult received- meeting scheduled for 2pm.   Mariana Kaufman, AGNP-C Palliative Medicine  Please call Palliative Medicine team phone with any questions [phone]. For individual providers please see AMION.  No charge

## 2019-09-29 DIAGNOSIS — K7011 Alcoholic hepatitis with ascites: Secondary | ICD-10-CM

## 2019-09-29 DIAGNOSIS — J151 Pneumonia due to Pseudomonas: Secondary | ICD-10-CM

## 2019-09-29 DIAGNOSIS — R188 Other ascites: Secondary | ICD-10-CM

## 2019-09-29 MED ORDER — SCOPOLAMINE 1 MG/3DAYS TD PT72
1.0000 | MEDICATED_PATCH | TRANSDERMAL | Status: DC
  Administered 2019-09-29: 1.5 mg via TRANSDERMAL
  Filled 2019-09-29: qty 1

## 2019-09-29 MED ORDER — LORAZEPAM 2 MG/ML IJ SOLN
2.0000 mg | Freq: Four times a day (QID) | INTRAMUSCULAR | Status: DC
  Administered 2019-09-29 – 2019-09-30 (×3): 2 mg via INTRAVENOUS
  Filled 2019-09-29 (×4): qty 1

## 2019-09-29 NOTE — TOC Progression Note (Signed)
Transition of Care [facility]) - Progression Note    Patient Details  Name: ROCIO ROME MRN: [id number] Date of Birth: 1956-09-14  Transition of Care [facility]) CM/SW [place], [place] Phone Number: 09/29/2019, 2:52 PM  Clinical Narrative:     CSW heard back from [facility] with May. [facility] does not have any beds available today. CSW will follow up on Sunday.   CSW will continue to follow and assist with discharge planning.   Expected Discharge Plan: [place] Barriers to Discharge: Hospice Bed not available  Expected Discharge Plan and Services Expected Discharge Plan: [place] In-house Referral: Clinical Social Work Discharge Planning Services: NA Post Acute Care Choice: Hospice Living arrangements for the past 2 months: Apartment                 DME Arranged: N/A DME Agency: NA       HH Arranged: NA HH Agency: NA         Social Determinants of Health (SDOH) Interventions    Readmission Risk Interventions No flowsheet data found.

## 2019-09-29 NOTE — Progress Notes (Addendum)
Manufacturing engineer [facility])  Referral received for residential hospice at [facility].  Awaiting availability determination from [facility].  Spoke with mother [name], answered questions and provided support.  ACC will update TOC manager and family once bed availability has been determined.  Thank you, Venia Carbon RN, BSN, [facility] Liaison (in [phone]  **[facility] does not have bed availability today

## 2019-09-29 NOTE — Progress Notes (Signed)
Daily Progress Note   Patient Name: Jack Mcintyre       Date: 09/29/2019 DOB: 1956-11-01  Age: 63 y.o. MRN#: [id number] Attending Physician: Shelly Coss, MD Primary Care Physician: [facility] Admit Date: 09/21/2019  Reason for Consultation/Follow-up: Establishing goals of care and Terminal Care  Subjective: Patient in bed eating. Noted that when he eats, he pretty much regurgitates food back up through his trach.  He is somewhat oriented, but Mom at bedside says he's been confused, anxious today. Has been hallucinating- seeing roaches on the walls. Has been telling her "this is a bad place".   ROS  Length of Stay: 8  Current Medications: Scheduled Meds:  . Chlorhexidine Gluconate Cloth  6 each Topical Daily  . levothyroxine  100 mcg Oral Q0600  . LORazepam  2 mg Intravenous Q6H  . nicotine  14 mg Transdermal Daily  . Oxcarbazepine  450 mg Oral QHS  . scopolamine  1 patch Transdermal Q72H  . tamsulosin  0.4 mg Oral QHS  . terbinafine   Topical BID    Continuous Infusions:   PRN Meds: atropine, LORazepam **OR** LORazepam **OR** LORazepam, morphine CONCENTRATE **OR** morphine CONCENTRATE, ondansetron **OR** ondansetron (ZOFRAN) IV  Physical Exam          Vital Signs: BP 94/73 (BP Location: Left Wrist)   Pulse (!) 50   Temp 97.7 F (36.5 C) (Oral)   Resp 14   Ht 6\' 1"  (1.854 m)   Wt 74.2 kg   SpO2 (!) 84%   BMI 21.58 kg/m  SpO2: SpO2: (!) 84 % O2 Device: O2 Device: Tracheostomy Collar O2 Flow Rate: O2 Flow Rate (L/min): 5 L/min  Intake/output summary:   Intake/Output Summary (Last 24 hours) at 09/29/2019 1756 Last data filed at 09/29/2019 1600 Gross per 24 hour  Intake 696.46 ml  Output 75 ml  Net 621.46 ml   LBM: Last BM Date: 09/29/19  Baseline Weight: Weight: 68 kg Most recent weight: Weight: 74.2 kg       Palliative Assessment/Data: PPS: 10%      Patient Active Problem List   Diagnosis Date Noted  . Alcoholic cirrhosis of liver with ascites ([place])   . Goals of care, counseling/discussion   . Advanced care planning/counseling discussion   . Palliative care by specialist   . Dysphagia   .  Hypokalemia 09/22/2019  . Hypoglycemia 09/22/2019  . Anemia 09/22/2019  . Lung nodule 09/22/2019  . Aspiration pneumonia ([place]) 09/21/2019  . Alcohol abuse 09/21/2019  . Malnutrition of moderate degree 05/17/2018  . Pneumonia 05/16/2018  . Acute kidney injury ([place]) 05/16/2018  . Hyponatremia 05/16/2018    Palliative Care Assessment & Plan   Patient Profile: 63 y.o. male  with past medical history of throat cancer, s/p radiation, chemo, trach, PEG, CKD IV, cirrhosis, ETOH abuse, chronic anemia . He pulled his PEG out several months ago and it was determined not to be replaced.  He was admitted on 09/21/2019 with aspiration pneumonia. He was recently discharged from the [facility] hospital for the same diagnosis. GI and IR were consulted for PEG placement, however, this interventions was not offered due to patient's advanced cirrhosis- CHILD C. Palliative consulted for further GOC.   Assessment/Recommendations/Plan   Will d/c steroids in attempt to decrease anxiety   Start lorazepam 2mg  IV q6hr for anxiety, agitation  Scope patch for audible secretions  Hopeful for discharge to [facility] and Additional Recommendations:  Limitations on Scope of Treatment: Full Comfort Care  Code Status:  DNR  Prognosis:   < 2 weeks  Discharge Planning:  Hospice facility  Care plan was discussed with patient's Mom and RN.  Thank you for allowing the Palliative Medicine Team to assist in the care of this patient.   Time In: 1725 Time Out: 1800 Total Time 35 mins Prolonged Time Billed no      Greater than 50%   of this time was spent counseling and coordinating care related to the above assessment and plan.  Mariana Kaufman, AGNP-C Palliative Medicine   Please contact Palliative Medicine Team phone at [phone] for questions and concerns.

## 2019-09-29 NOTE — Progress Notes (Signed)
PROGRESS NOTE    Jack Mcintyre  RRN:[id number] DOB: 05/15/1956 DOA: 09/21/2019 PCP: [facility]   Brief Narrative:  Patient is a 63 year old male with history of throat  cancer status post radiation treatment, status post trach and PEG in 2015, chronic cough, history of tobacco/alcohol abuse, CKD stage III, chronic anemia who presents here with concern for declining health, weakness, confusion and shortness of breath.  He was noted to be hypotensive on arrival.  He failed bedside swallowing evaluation and was found to have aspiration pneumonia patient with chest x-ray and CT finding.  He was started on broad spectrum antibiotics for aspiration pneumonia. Covid -19  was negative.  SLP was consulted.  MBS was done and was found to have moderate aspiration risks and recommended n.p.o. IR/general surgery consulted for PEG are G-tube placement but declined week saying he is not a candidate.  On 09/26/2019 he became hypotensive and hypothermic so he was transferred to ICU.  Patient transferred back to [facility] service on 09/29/2019.  Palliative team met with family for goals of care discussion/possible hospice. Comfort care initiated.Plan is to transfer him to residential hospice as soon as bed is available.  Assessment & Plan:   Active Problems:   Acute kidney injury (HCC)   Malnutrition of moderate degree   Aspiration pneumonia (HCC)   Alcohol abuse   Hypokalemia   Hypoglycemia   Anemia   Lung nodule   Alcoholic cirrhosis of liver with ascites (HCC)   Goals of care, counseling/discussion   Advanced care planning/counseling discussion   Palliative care by specialist   Dysphagia   Recurrent aspiration pneumonia/dysphagia: Speech was following.  MBS was done.  Has moderate aspiration risk.  Recommend n.p.o.  IR consulted for PEG tube placement.But due to ascites ,IR cannot place PEG tube.  General surgery also declined putting PEG or G-tube saying he is not a candidate from surgical  standpoint. After discussion with family, he has been started on comfort care.  Oral diet started.  History of throat cancer: Status post radiation treatment.  On trach.  Hypotension: Blood pressure continues to remain low in the upper 80s.    CKD stage III: Baseline creatinine 1.4-1.9   Chronic alcohol abuse: History of drinking 1 pint of liquor every day.    Chronic anemia: Most likely associated with chronic comorbidities/medical issues,chronic alcohol abuse.  He was transfused with a unit of  PRBC on 09/21/2019.   Lung nodule: Known finding.  CT chest showed Interval enlargement of a somewhat spiculated appearing nodule in the 8 focal right upper lobe, measuring 1.7 x 1.3 cm, previously 1.3 x 0.6 cm . Concerning for malignany.  Anasarca/ascites: Due to poor oral intake/hypoalbuminemia or liver cirrhosis.  Irregular/nodular surface of liver as per CT abdomen.Status post 2 L of paracentesis on 09/22/2019.  Culture negative.  No abdominal pain or tenderness on exam.  Thrombocytopenia: Chronic.  Associated  with chronic alcohol abuse.     Nutrition Problem: Inadequate oral intake Etiology: dysphagia, cancer and cancer related treatments(throat cancer)      DVT prophylaxis: Lovenox Code Status: Comfort care Family Communication: Discussed with mother earlier Disposition Plan: Residential hospice   Consultants: general surgery,PCCM,palliative care  Procedures:None  Antimicrobials:  Anti-infectives (From admission, onward)   Start     Dose/Rate Route Frequency Ordered Stop   09/27/19 1900  vancomycin (VANCOCIN) IVPB 1000 mg/200 mL premix  Status:  Discontinued     1,000 mg 200 mL/hr over 60 Minutes Intravenous Every 24 hours 09/26/19  1859 09/28/19 1726   09/26/19 1900  vancomycin (VANCOCIN) 1,500 mg in sodium chloride 0.9 % 500 mL IVPB     1,500 mg 250 mL/hr over 120 Minutes Intravenous  Once 09/26/19 1859 09/27/19 0129   09/22/19 0800  piperacillin-tazobactam (ZOSYN) IVPB  3.375 g  Status:  Discontinued     3.375 g 12.5 mL/hr over 240 Minutes Intravenous Every 8 hours 09/22/19 0112 09/29/19 0745   09/21/19 2145  Ampicillin-Sulbactam (UNASYN) 3 g in sodium chloride 0.9 % 100 mL IVPB     3 g 200 mL/hr over 30 Minutes Intravenous  Once 09/21/19 2139 09/22/19 0020      Subjective: Patient seen and examined at bedside this morning.  Blood pressure still low.  He was not in any kind of distress.  Eating his breakfast.  Objective: Vitals:   09/29/19 0600 09/29/19 0700 09/29/19 0739 09/29/19 0800  BP:      Pulse: 78 89 67 (!) 101  Resp: 16 (!) _0 Temp:      TempSrc:      SpO2: 100% 100% 100% 100%  Weight:      Height:        Intake/Output Summary (Last 24 hours) at 09/29/2019 1116 Last data filed at 09/29/2019 0800 Gross per 24 hour  Intake 437.63 ml  Output 75 ml  Net 362.63 ml   Filed Weights   09/21/19 2007 09/22/19 0100  Weight: 68 kg 74.2 kg    Examination:  General exam: Not in distress, extremely deconditioned/debilitated  HEENT: Trach with  secretions. Respiratory system: Bilateral decreased air entry on bases Cardiovascular system: S1 & S2 heard, RRR. No JVD, murmurs, rubs, gallops or clicks. No pedal edema. Gastrointestinal system: Abdomen is mildly distended, soft and nontender Central nervous system: Alert and awake Extremities: No edema, no clubbing ,no cyanosis   Data Reviewed: I have personally reviewed following labs and imaging studies  CBC: Recent Labs  Lab 09/24/19 1041 09/25/19 0352 09/26/19 0357 09/26/19 1954 09/27/19 0231 09/28/19 0245  WBC 3.0* 3.7* 3.1*  --  3.7* 7.9  NEUTROABS 2.1 2.4 2.0  --  3.1 7.4  HGB 9.6* 10.4* 9.7* 9.9* 9.4* 10.2*  HCT 29.6* 30.0* 30.0* 29.0* 28.3* 30.2*  MCV 100.3* 96.2 100.7*  --  99.0 98.7  PLT 85* 78* 72*  --  81* 87*   Basic Metabolic Panel: Recent Labs  Lab 09/22/19 1129  09/24/19 1041 09/25/19 0352 09/26/19 0357 09/26/19 1954 09/26/19 2300 09/27/19 0231  09/28/19 0245  NA 145   < > 147* 146* 141 142 140 142  --   K 3.2*   < > 3.1* 4.0 3.3* 3.1* 3.3* 3.8  --   CL 109   < > 114* 115* 111  --  112* 115*  --   CO2 27   < > 24 21* 21*  --  20* 20*  --   GLUCOSE 92   < > 86 71 81  --  136* 96  --   BUN 12   < > _1 --  11 11  --   CREATININE 1.72*   < > 1.73* 1.89* 1.81*  --  1.67* 1.78*  --   CALCIUM 8.7*   < > 8.0* 7.9* 7.8*  --  7.7* 7.8*  --   MG 1.8  --  1.7  --   --   --   --  1.4* 2.1   < > = values in this interval  not displayed.   GFR: Estimated Creatinine Clearance: 45.2 mL/min (A) (by C-G formula based on SCr of 1.78 mg/dL (H)). Liver Function Tests: Recent Labs  Lab 09/22/19 1129 09/23/19 1359 09/27/19 0231  AST _0 ALT _1 ALKPHOS 55 47 38  BILITOT 0.6 0.8 0.6  PROT 6.4* 5.8* 5.8*  ALBUMIN 2.5* 2.0* 2.4*   No results for input(s): LIPASE, AMYLASE in the last 168 hours. Recent Labs  Lab 09/26/19 2300  AMMONIA 47*   Coagulation Profile: No results for input(s): INR, PROTIME in the last 168 hours. Cardiac Enzymes: No results for input(s): CKTOTAL, CKMB, CKMBINDEX, TROPONINI in the last 168 hours. BNP (last 3 results) No results for input(s): PROBNP in the last 8760 hours. HbA1C: No results for input(s): HGBA1C in the last 72 hours. CBG: Recent Labs  Lab 09/27/19 1958 09/27/19 2353 09/28/19 0416 09/28/19 0717 09/28/19 1117  GLUCAP 106* 86 77 78 81   Lipid Profile: No results for input(s): CHOL, HDL, LDLCALC, TRIG, CHOLHDL, LDLDIRECT in the last 72 hours. Thyroid Function Tests: No results for input(s): TSH, T4TOTAL, FREET4, T3FREE, THYROIDAB in the last 72 hours. Anemia Panel: No results for input(s): VITAMINB12, FOLATE, FERRITIN, TIBC, IRON, RETICCTPCT in the last 72 hours. Sepsis Labs: Recent Labs  Lab 09/24/19 1358 09/24/19 1626 09/26/19 2300 09/27/19 0231 09/28/19 0245  PROCALCITON  --   --  <0.10 <0.10 <0.10  LATICACIDVEN 1.3 1.4 1.3  --   --     Recent Results (from the  past 240 hour(s))  SARS Coronavirus 2 [facility] order, Performed in [facility] hospital lab) Nasopharyngeal Nasopharyngeal Swab     Status: None   Collection Time: 09/21/19  8:10 PM   Specimen: Nasopharyngeal Swab  Result Value Ref Range Status   SARS Coronavirus 2 NEGATIVE NEGATIVE Final    Comment: (NOTE) If result is NEGATIVE SARS-CoV-2 target nucleic acids are NOT DETECTED. The SARS-CoV-2 RNA is generally detectable in upper and lower  respiratory specimens during the acute phase of infection. The lowest  concentration of SARS-CoV-2 viral copies this assay can detect is 250  copies / mL. A negative result does not preclude SARS-CoV-2 infection  and should not be used as the sole basis for treatment or other  patient management decisions.  A negative result may occur with  improper specimen collection / handling, submission of specimen other  than nasopharyngeal swab, presence of viral mutation(s) within the  areas targeted by this assay, and inadequate number of viral copies  (<250 copies / mL). A negative result must be combined with clinical  observations, patient history, and epidemiological information. If result is POSITIVE SARS-CoV-2 target nucleic acids are DETECTED. The SARS-CoV-2 RNA is generally detectable in upper and lower  respiratory specimens dur ing the acute phase of infection.  Positive  results are indicative of active infection with SARS-CoV-2.  Clinical  correlation with patient history and other diagnostic information is  necessary to determine patient infection status.  Positive results do  not rule out bacterial infection or co-infection with other viruses. If result is PRESUMPTIVE POSTIVE SARS-CoV-2 nucleic acids MAY BE PRESENT.   A presumptive positive result was obtained on the submitted specimen  and confirmed on repeat testing.  While 2019 novel coronavirus  (SARS-CoV-2) nucleic acids may be present in the submitted sample  additional confirmatory  testing may be necessary for epidemiological  and / or clinical management purposes  to differentiate between  SARS-CoV-2 and other Sarbecovirus currently known to  infect humans.  If clinically indicated additional testing with an alternate test  methodology [phone]) is advised. The SARS-CoV-2 RNA is generally  detectable in upper and lower respiratory sp ecimens during the acute  phase of infection. The expected result is Negative. Fact Sheet for Patients:  StrictlyIdeas.no Fact Sheet for Healthcare Providers: BankingDealers.co.za This test is not yet approved or cleared by the [place] FDA and has been authorized for detection and/or diagnosis of SARS-CoV-2 by FDA under an Emergency Use Authorization (EUA).  This EUA will remain in effect (meaning this test can be used) for the duration of the COVID-19 declaration under Section 564(b)(1) of the Act, 21 U.S.C. section 360bbb-3(b)(1), unless the authorization is terminated or revoked sooner. Performed at [facility], [place] [address], [place], [place] [address]   MRSA PCR Screening     Status: Abnormal   Collection Time: 09/22/19  1:55 AM   Specimen: Nasal Mucosa; Nasopharyngeal  Result Value Ref Range Status   MRSA by PCR POSITIVE (A) NEGATIVE Final    Comment:        The GeneXpert MRSA Assay (FDA approved for NASAL specimens only), is one component of a comprehensive MRSA colonization surveillance program. It is not intended to diagnose MRSA infection nor to guide or monitor treatment for MRSA infections. RESULT CALLED TO, READ BACK BY AND VERIFIED WITHPricilla Larsson RN 09/22/19 0420 JDW Performed at [facility] [address], [place], [place] [address]   Culture, blood (routine x 2)     Status: None   Collection Time: 09/22/19 11:58 AM   Specimen: BLOOD RIGHT ARM  Result Value Ref Range Status   Specimen Description BLOOD RIGHT ARM  Final   Special Requests    Final    BOTTLES DRAWN AEROBIC AND ANAEROBIC Blood Culture adequate volume   Culture   Final    NO GROWTH 5 DAYS Performed at [facility], [address] [place], [place] [address]    Report Status 09/27/2019 FINAL  Final  Culture, blood (routine x 2)     Status: None   Collection Time: 09/22/19 11:58 AM   Specimen: BLOOD RIGHT ARM  Result Value Ref Range Status   Specimen Description BLOOD RIGHT ARM  Final   Special Requests   Final    BOTTLES DRAWN AEROBIC AND ANAEROBIC Blood Culture adequate volume   Culture   Final    NO GROWTH 5 DAYS Performed at [facility], [place] [address], [place], [place] [address]    Report Status 09/27/2019 FINAL  Final  Respiratory Panel by PCR     Status: None   Collection Time: 09/22/19 12:33 PM   Specimen: Nasopharyngeal Swab; Respiratory  Result Value Ref Range Status   Adenovirus NOT DETECTED NOT DETECTED Final   Coronavirus 229E NOT DETECTED NOT DETECTED Final    Comment: (NOTE) The Coronavirus on the Respiratory Panel, DOES NOT test for the novel  Coronavirus (2019 nCoV)    Coronavirus HKU1 NOT DETECTED NOT DETECTED Final   Coronavirus NL63 NOT DETECTED NOT DETECTED Final   Coronavirus OC43 NOT DETECTED NOT DETECTED Final   Metapneumovirus NOT DETECTED NOT DETECTED Final   Rhinovirus / Enterovirus NOT DETECTED NOT DETECTED Final   Influenza A NOT DETECTED NOT DETECTED Final   Influenza B NOT DETECTED NOT DETECTED Final   Parainfluenza Virus 1 NOT DETECTED NOT DETECTED Final   Parainfluenza Virus 2 NOT DETECTED NOT DETECTED Final   Parainfluenza Virus 3  NOT DETECTED NOT DETECTED Final   Parainfluenza Virus 4 NOT DETECTED NOT DETECTED Final   Respiratory Syncytial Virus NOT DETECTED NOT DETECTED Final   Bordetella pertussis NOT DETECTED NOT DETECTED Final   Chlamydophila pneumoniae NOT DETECTED NOT DETECTED Final   Mycoplasma pneumoniae NOT DETECTED NOT DETECTED Final    Comment: Performed at [facility], [place]  [address] [place], [place] [address]  Gram stain     Status: None   Collection Time: 09/22/19  3:35 PM   Specimen: PATH Cytology Peritoneal fluid  Result Value Ref Range Status   Specimen Description PERITONEAL  Final   Special Requests NONE  Final   Gram Stain   Final    NO WBC SEEN NO ORGANISMS SEEN Performed at [facility], [address], [place], [place] [address]    Report Status 09/22/2019 FINAL  Final  Culture, body fluid-bottle     Status: None   Collection Time: 09/22/19  3:35 PM   Specimen: Peritoneal Washings  Result Value Ref Range Status   Specimen Description PERITONEAL  Final   Special Requests NONE  Final   Culture   Final    NO GROWTH 5 DAYS Performed at [facility], [address], [place], [place] [address]    Report Status 09/27/2019 FINAL  Final  Culture, blood (routine x 2)     Status: None (Preliminary result)   Collection Time: 09/26/19 11:23 PM   Specimen: BLOOD RIGHT HAND  Result Value Ref Range Status   Specimen Description BLOOD RIGHT HAND  Final   Special Requests   Final    BOTTLES DRAWN AEROBIC AND ANAEROBIC Blood Culture adequate volume   Culture   Final    NO GROWTH 1 DAY Performed at [facility], [place] [address] [place], [place] [address]    Report Status PENDING  Incomplete  Culture, blood (routine x 2)     Status: None (Preliminary result)   Collection Time: 09/26/19 11:23 PM   Specimen: BLOOD LEFT HAND  Result Value Ref Range Status   Specimen Description BLOOD LEFT HAND  Final   Special Requests   Final    BOTTLES DRAWN AEROBIC AND ANAEROBIC Blood Culture adequate volume   Culture   Final    NO GROWTH 1 DAY Performed at [facility], [place] [address], [place], [place] [address]    Report Status PENDING  Incomplete         Radiology Studies: No results found.      Scheduled Meds: . Chlorhexidine Gluconate Cloth  6 each Topical Daily  . hydrocortisone sod succinate (SOLU-CORTEF) inj  50 mg  Intravenous Q6H  . levothyroxine  100 mcg Oral Q0600  . midodrine  5 mg Oral BID WC  . nicotine  14 mg Transdermal Daily  . Oxcarbazepine  450 mg Oral QHS  . tamsulosin  0.4 mg Oral QHS  . terbinafine   Topical BID   Continuous Infusions:    LOS: 8 days    Time spent: 35 mins.More than 50% of that time was spent in counseling and/or coordination of care.      Shelly Coss, MD [facility] Pager [phone]  If 7PM-7AM, please contact night-coverage [email or web address] Password [email or web address] 09/29/2019, 11:16 AM

## 2019-09-29 NOTE — TOC Progression Note (Signed)
Transition of Care [facility]) - Progression Note    Patient Details  Name: Jack Mcintyre MRN: [id number] Date of Birth: 03-09-1956  Transition of Care [facility] CM/SW [place], [place] Phone Number: 09/29/2019, 8:58 AM  Clinical Narrative:     CSW received a consult for the patient to transition to residential hospice. CSW had worked the patient up for SNF, but it appears that he has declined. After reviewing PMT note, the family would like to transition to comfort care.   CSW called Anderson Malta with [facility]. She stated that she would contact [facility] to see if there were any beds available today. She stated that she would let the CSW know. She took the patient's information to start the referral process. Anderson Malta will follow up with the CSW once there is a bed available.   CSW will continue to follow and assist with placement.   Expected Discharge Plan: [place] Barriers to Discharge: Hospice Bed not available  Expected Discharge Plan and Services Expected Discharge Plan: [place] In-house Referral: Clinical Social Work Discharge Planning Services: NA Post Acute Care Choice: Hospice Living arrangements for the past 2 months: Apartment                 DME Arranged: N/A DME Agency: NA       HH Arranged: NA HH Agency: NA         Social Determinants of Health (SDOH) Interventions    Readmission Risk Interventions No flowsheet data found.

## 2019-09-30 NOTE — Progress Notes (Signed)
[facility] Documentation  Writer spoke with pt's mother, Raziel Camire, to inform of no current bed availability at [facility]. Encouraged her to call [facility] with any questions in the interim.  Also advised TOC manager of no current bed availability.   Freddie Breech, RN [facility] Liaison [phone]

## 2019-09-30 NOTE — Progress Notes (Signed)
PROGRESS NOTE    Jack Mcintyre  HFW:[id number] DOB: [birth year] DOA: 09/21/2019 PCP: [facility]   Brief Narrative:  Patient is a 63 year old male with history of throat  cancer status post radiation treatment, status post trach and PEG in 2015, chronic cough, history of tobacco/alcohol abuse, CKD stage III, chronic anemia who presents here with concern for declining health, weakness, confusion and shortness of breath.  He was noted to be hypotensive on arrival.  He failed bedside swallowing evaluation and was found to have aspiration pneumonia patient with chest x-ray and CT finding.  He was started on broad spectrum antibiotics for aspiration pneumonia. Covid -19  was negative.  SLP was consulted.  MBS was done and was found to have moderate aspiration risks and recommended n.p.o. IR/general surgery consulted for PEG are G-tube placement but declined week saying he is not a candidate.  On 09/26/2019 he became hypotensive and hypothermic so he was transferred to ICU.  Patient transferred back to [facility] service on 09/29/2019.  Palliative team met with family for goals of care discussion/possible hospice. Comfort care initiated.Plan is to transfer him to residential hospice as soon as bed is available.  Assessment & Plan:   Active Problems:   Acute kidney injury (HCC)   Malnutrition of moderate degree   Aspiration pneumonia (HCC)   Alcohol abuse   Hypokalemia   Hypoglycemia   Anemia   Lung nodule   Alcoholic cirrhosis of liver with ascites (HCC)   Goals of care, counseling/discussion   Advanced care planning/counseling discussion   Palliative care by specialist   Dysphagia   Ascites   Recurrent aspiration pneumonia/dysphagia: Speech was following.  MBS was done.  Has moderate aspiration risk.  Recommend n.p.o.  IR consulted for PEG tube placement.But due to ascites ,IR cannot place PEG tube.  General surgery also declined putting PEG or G-tube saying he is not a candidate from  surgical standpoint. After discussion with family, he has been started on comfort care.  Oral diet started.  History of throat cancer: Status post radiation treatment.  On trach.  Hypotension: Blood pressure continues to remain low in the upper 80s.    CKD stage III: Baseline creatinine 1.4-1.9   Chronic alcohol abuse: History of drinking 1 pint of liquor every day.    Chronic anemia: Most likely associated with chronic comorbidities/medical issues,chronic alcohol abuse.  He was transfused with a unit of  PRBC on 09/21/2019.   Lung nodule: Known finding.  CT chest showed Interval enlargement of a somewhat spiculated appearing nodule in the 8 focal right upper lobe, measuring 1.7 x 1.3 cm, previously 1.3 x 0.6 cm . Concerning for malignany.  Anasarca/ascites: Due to poor oral intake/hypoalbuminemia or liver cirrhosis.  Irregular/nodular surface of liver as per CT abdomen.Status post 2 L of paracentesis on 09/22/2019.  Culture negative.  No abdominal pain or tenderness on exam.  Thrombocytopenia: Chronic.  Associated  with chronic alcohol abuse.     Nutrition Problem: Inadequate oral intake Etiology: dysphagia, cancer and cancer related treatments(throat cancer)      DVT prophylaxis: Lovenox Code Status: Comfort care Family Communication: Discussed with mother face to face on 09/29/19 Disposition Plan: Residential hospice as soon as bed is available   Consultants: general surgery,PCCM,palliative care  Procedures:None  Antimicrobials:  Anti-infectives (From admission, onward)   Start     Dose/Rate Route Frequency Ordered Stop   09/27/19 1900  vancomycin (VANCOCIN) IVPB 1000 mg/200 mL premix  Status:  Discontinued  1,000 mg 200 mL/hr over 60 Minutes Intravenous Every 24 hours 09/26/19 1859 09/28/19 1726   09/26/19 1900  vancomycin (VANCOCIN) 1,500 mg in sodium chloride 0.9 % 500 mL IVPB     1,500 mg 250 mL/hr over 120 Minutes Intravenous  Once 09/26/19 1859 09/27/19 0129    09/22/19 0800  piperacillin-tazobactam (ZOSYN) IVPB 3.375 g  Status:  Discontinued     3.375 g 12.5 mL/hr over 240 Minutes Intravenous Every 8 hours 09/22/19 0112 09/29/19 0745   09/21/19 2145  Ampicillin-Sulbactam (UNASYN) 3 g in sodium chloride 0.9 % 100 mL IVPB     3 g 200 mL/hr over 30 Minutes Intravenous  Once 09/21/19 2139 09/22/19 0020      Subjective: Patient seen and examined at bedside this morning.  He was in deep sleep and was hardly arousable.  He does not appear to be any kind of distress today.  Hospice is following and waiting for bed at [facility].  Objective: Vitals:   09/30/19 0800 09/30/19 0851 09/30/19 0900 09/30/19 1000  BP:      Pulse: (!) 103 92 96 96  Resp: _0 Temp:      TempSrc:      SpO2: 99% 94% 94% 93%  Weight:      Height:        Intake/Output Summary (Last 24 hours) at 09/30/2019 1046 Last data filed at 09/30/2019 0300 Gross per 24 hour  Intake 440 ml  Output -  Net 440 ml   Filed Weights   09/21/19 2007 09/22/19 0100  Weight: 68 kg 74.2 kg    Examination:  General exam: Not in distress, drowsy,lethargic  HEENT: Trach  Respiratory system: Bilateral decreased air entry ,no wheezes or crackles Cardiovascular system: S1 & S2 heard, RRRNo pedal edema. Gastrointestinal system: Abdomen is mildly distended, soft and nontender Central nervous system: Drowsy.sleepy.  Open his eyes on calling his name. Extremities: No edema, no clubbing ,no cyanosis   Data Reviewed: I have personally reviewed following labs and imaging studies  CBC: Recent Labs  Lab 09/24/19 1041 09/25/19 0352 09/26/19 0357 09/26/19 1954 09/27/19 0231 09/28/19 0245  WBC 3.0* 3.7* 3.1*  --  3.7* 7.9  NEUTROABS 2.1 2.4 2.0  --  3.1 7.4  HGB 9.6* 10.4* 9.7* 9.9* 9.4* 10.2*  HCT 29.6* 30.0* 30.0* 29.0* 28.3* 30.2*  MCV 100.3* 96.2 100.7*  --  99.0 98.7  PLT 85* 78* 72*  --  81* 87*   Basic Metabolic Panel: Recent Labs  Lab 09/24/19 1041 09/25/19 0352  09/26/19 0357 09/26/19 1954 09/26/19 2300 09/27/19 0231 09/28/19 0245  NA 147* 146* 141 142 140 142  --   K 3.1* 4.0 3.3* 3.1* 3.3* 3.8  --   CL 114* 115* 111  --  112* 115*  --   CO2 24 21* 21*  --  20* 20*  --   GLUCOSE 86 71 81  --  136* 96  --   BUN _1 --  11 11  --   CREATININE 1.73* 1.89* 1.81*  --  1.67* 1.78*  --   CALCIUM 8.0* 7.9* 7.8*  --  7.7* 7.8*  --   MG 1.7  --   --   --   --  1.4* 2.1   GFR: Estimated Creatinine Clearance: 45.2 mL/min (A) (by C-G formula based on SCr of 1.78 mg/dL (H)). Liver Function Tests: Recent Labs  Lab 09/23/19 1359 09/27/19 0231  AST 22 16  ALT 12 9  ALKPHOS 47 38  BILITOT 0.8 0.6  PROT 5.8* 5.8*  ALBUMIN 2.0* 2.4*   No results for input(s): LIPASE, AMYLASE in the last 168 hours. Recent Labs  Lab 09/26/19 2300  AMMONIA 47*   Coagulation Profile: No results for input(s): INR, PROTIME in the last 168 hours. Cardiac Enzymes: No results for input(s): CKTOTAL, CKMB, CKMBINDEX, TROPONINI in the last 168 hours. BNP (last 3 results) No results for input(s): PROBNP in the last 8760 hours. HbA1C: No results for input(s): HGBA1C in the last 72 hours. CBG: Recent Labs  Lab 09/27/19 1958 09/27/19 2353 09/28/19 0416 09/28/19 0717 09/28/19 1117  GLUCAP 106* 86 77 78 81   Lipid Profile: No results for input(s): CHOL, HDL, LDLCALC, TRIG, CHOLHDL, LDLDIRECT in the last 72 hours. Thyroid Function Tests: No results for input(s): TSH, T4TOTAL, FREET4, T3FREE, THYROIDAB in the last 72 hours. Anemia Panel: No results for input(s): VITAMINB12, FOLATE, FERRITIN, TIBC, IRON, RETICCTPCT in the last 72 hours. Sepsis Labs: Recent Labs  Lab 09/24/19 1358 09/24/19 1626 09/26/19 2300 09/27/19 0231 09/28/19 0245  PROCALCITON  --   --  <0.10 <0.10 <0.10  LATICACIDVEN 1.3 1.4 1.3  --   --     Recent Results (from the past 240 hour(s))  SARS Coronavirus 2 [facility] order, Performed in [facility] hospital lab) Nasopharyngeal  Nasopharyngeal Swab     Status: None   Collection Time: 09/21/19  8:10 PM   Specimen: Nasopharyngeal Swab  Result Value Ref Range Status   SARS Coronavirus 2 NEGATIVE NEGATIVE Final    Comment: (NOTE) If result is NEGATIVE SARS-CoV-2 target nucleic acids are NOT DETECTED. The SARS-CoV-2 RNA is generally detectable in upper and lower  respiratory specimens during the acute phase of infection. The lowest  concentration of SARS-CoV-2 viral copies this assay can detect is 250  copies / mL. A negative result does not preclude SARS-CoV-2 infection  and should not be used as the sole basis for treatment or other  patient management decisions.  A negative result may occur with  improper specimen collection / handling, submission of specimen other  than nasopharyngeal swab, presence of viral mutation(s) within the  areas targeted by this assay, and inadequate number of viral copies  (<250 copies / mL). A negative result must be combined with clinical  observations, patient history, and epidemiological information. If result is POSITIVE SARS-CoV-2 target nucleic acids are DETECTED. The SARS-CoV-2 RNA is generally detectable in upper and lower  respiratory specimens dur ing the acute phase of infection.  Positive  results are indicative of active infection with SARS-CoV-2.  Clinical  correlation with patient history and other diagnostic information is  necessary to determine patient infection status.  Positive results do  not rule out bacterial infection or co-infection with other viruses. If result is PRESUMPTIVE POSTIVE SARS-CoV-2 nucleic acids MAY BE PRESENT.   A presumptive positive result was obtained on the submitted specimen  and confirmed on repeat testing.  While 2019 novel coronavirus  (SARS-CoV-2) nucleic acids may be present in the submitted sample  additional confirmatory testing may be necessary for epidemiological  and / or clinical management purposes  to differentiate between   SARS-CoV-2 and other Sarbecovirus currently known to infect humans.  If clinically indicated additional testing with an alternate test  methodology [phone]) is advised. The SARS-CoV-2 RNA is generally  detectable in upper and lower respiratory sp ecimens during the acute  phase of infection. The expected result is Negative. Fact Sheet  for Patients:  StrictlyIdeas.no Fact Sheet for Healthcare Providers: BankingDealers.co.za This test is not yet approved or cleared by the [place] FDA and has been authorized for detection and/or diagnosis of SARS-CoV-2 by FDA under an Emergency Use Authorization (EUA).  This EUA will remain in effect (meaning this test can be used) for the duration of the COVID-19 declaration under Section 564(b)(1) of the Act, 21 U.S.C. section 360bbb-3(b)(1), unless the authorization is terminated or revoked sooner. Performed at [facility], [place] [address], [place], [place] [address]   MRSA PCR Screening     Status: Abnormal   Collection Time: 09/22/19  1:55 AM   Specimen: Nasal Mucosa; Nasopharyngeal  Result Value Ref Range Status   MRSA by PCR POSITIVE (A) NEGATIVE Final    Comment:        The GeneXpert MRSA Assay (FDA approved for NASAL specimens only), is one component of a comprehensive MRSA colonization surveillance program. It is not intended to diagnose MRSA infection nor to guide or monitor treatment for MRSA infections. RESULT CALLED TO, READ BACK BY AND VERIFIED WITHPricilla Larsson RN 09/22/19 0420 JDW Performed at [place] [address] [place], [place] [address]   Culture, blood (routine x 2)     Status: None   Collection Time: 09/22/19 11:58 AM   Specimen: BLOOD RIGHT ARM  Result Value Ref Range Status   Specimen Description BLOOD RIGHT ARM  Final   Special Requests   Final    BOTTLES DRAWN AEROBIC AND ANAEROBIC Blood Culture adequate volume   Culture   Final    NO GROWTH 5  DAYS Performed at [facility], [address], [place], [place] [address]    Report Status 09/27/2019 FINAL  Final  Culture, blood (routine x 2)     Status: None   Collection Time: 09/22/19 11:58 AM   Specimen: BLOOD RIGHT ARM  Result Value Ref Range Status   Specimen Description BLOOD RIGHT ARM  Final   Special Requests   Final    BOTTLES DRAWN AEROBIC AND ANAEROBIC Blood Culture adequate volume   Culture   Final    NO GROWTH 5 DAYS Performed at [facility], [place] [address] [place], [place] [address]    Report Status 09/27/2019 FINAL  Final  Respiratory Panel by PCR     Status: None   Collection Time: 09/22/19 12:33 PM   Specimen: Nasopharyngeal Swab; Respiratory  Result Value Ref Range Status   Adenovirus NOT DETECTED NOT DETECTED Final   Coronavirus 229E NOT DETECTED NOT DETECTED Final    Comment: (NOTE) The Coronavirus on the Respiratory Panel, DOES NOT test for the novel  Coronavirus (2019 nCoV)    Coronavirus HKU1 NOT DETECTED NOT DETECTED Final   Coronavirus NL63 NOT DETECTED NOT DETECTED Final   Coronavirus OC43 NOT DETECTED NOT DETECTED Final   Metapneumovirus NOT DETECTED NOT DETECTED Final   Rhinovirus / Enterovirus NOT DETECTED NOT DETECTED Final   Influenza A NOT DETECTED NOT DETECTED Final   Influenza B NOT DETECTED NOT DETECTED Final   Parainfluenza Virus 1 NOT DETECTED NOT DETECTED Final   Parainfluenza Virus 2 NOT DETECTED NOT DETECTED Final   Parainfluenza Virus 3 NOT DETECTED NOT DETECTED Final   Parainfluenza Virus 4 NOT DETECTED NOT DETECTED Final   Respiratory Syncytial Virus NOT DETECTED NOT DETECTED Final   Bordetella pertussis NOT DETECTED NOT DETECTED Final   Chlamydophila pneumoniae NOT DETECTED NOT DETECTED Final  Mycoplasma pneumoniae NOT DETECTED NOT DETECTED Final    Comment: Performed at [facility], [place] [address], [place], [place] [address]  Gram stain     Status: None   Collection Time: 09/22/19  3:35 PM   Specimen:  PATH Cytology Peritoneal fluid  Result Value Ref Range Status   Specimen Description PERITONEAL  Final   Special Requests NONE  Final   Gram Stain   Final    NO WBC SEEN NO ORGANISMS SEEN Performed at [facility], [address], [place], [place] [address]    Report Status 09/22/2019 FINAL  Final  Culture, body fluid-bottle     Status: None   Collection Time: 09/22/19  3:35 PM   Specimen: Peritoneal Washings  Result Value Ref Range Status   Specimen Description PERITONEAL  Final   Special Requests NONE  Final   Culture   Final    NO GROWTH 5 DAYS Performed at [facility], [address], [place], [place] [address]    Report Status 09/27/2019 FINAL  Final  Culture, blood (routine x 2)     Status: None (Preliminary result)   Collection Time: 09/26/19 11:23 PM   Specimen: BLOOD RIGHT HAND  Result Value Ref Range Status   Specimen Description BLOOD RIGHT HAND  Final   Special Requests   Final    BOTTLES DRAWN AEROBIC AND ANAEROBIC Blood Culture adequate volume   Culture   Final    NO GROWTH 2 DAYS Performed at [facility], [place] [address], [place], [place] [address]    Report Status PENDING  Incomplete  Culture, blood (routine x 2)     Status: None (Preliminary result)   Collection Time: 09/26/19 11:23 PM   Specimen: BLOOD LEFT HAND  Result Value Ref Range Status   Specimen Description BLOOD LEFT HAND  Final   Special Requests   Final    BOTTLES DRAWN AEROBIC AND ANAEROBIC Blood Culture adequate volume   Culture   Final    NO GROWTH 2 DAYS Performed at [facility], [place] [address] [place], [place] [address]    Report Status PENDING  Incomplete         Radiology Studies: No results found.      Scheduled Meds: . Chlorhexidine Gluconate Cloth  6 each Topical Daily  . levothyroxine  100 mcg Oral Q0600  . LORazepam  2 mg Intravenous Q6H  . nicotine  14 mg Transdermal Daily  . Oxcarbazepine  450 mg Oral QHS  . scopolamine  1 patch  Transdermal Q72H  . tamsulosin  0.4 mg Oral QHS  . terbinafine   Topical BID   Continuous Infusions:    LOS: 9 days    Time spent: 35 mins.More than 50% of that time was spent in counseling and/or coordination of care.      Shelly Coss, MD [facility] Pager [phone]  If 7PM-7AM, please contact night-coverage [email or web address] Password [email or web address] 09/30/2019, 10:46 AM

## 2019-09-30 NOTE — Progress Notes (Signed)
[facility] Documentation  RN called pt's mother to inform that [facility] bed will be available tomorrow morning. [name] to call [facility] with any questions in the interim.  Freddie Breech, RN [facility] Liaison [phone]

## 2019-09-30 NOTE — Progress Notes (Signed)
[facility] paged d/t mom's request for bilateral wrist restraints to be placed on patient. Patient continuously trying to get out of bed (high fall risk). Paged MD with moms concerns. Order placed for bilateral wrist restraints.

## 2019-10-01 NOTE — Progress Notes (Signed)
[facility] is here to transport patient to [facility]. Placed on 28% trach collar for transport.  Ashley Mariner RRT

## 2019-10-01 NOTE — Progress Notes (Addendum)
11:45am: CSW was notified by RN CM that the patient's family will complete paperwork at 93 today at [facility]. CSW will arranged for transportation via [facility] to [facility].  9:45am: CSW spoke with Harmon Pier of [employer] who states she is attempting to reach family to have documentation completed prior to this patient coming to [facility]. Harmon Pier will attempt to reach family and return call.  Madilyn Fireman, MSW, LCSW-A Transitions of Care  Clinical Social Worker  [facility] Emergency Departments  Medical ICU [phone]

## 2019-10-01 NOTE — Discharge Summary (Signed)
Physician Discharge Summary  Jack Mcintyre:[id number] DOB: [birth year] DOA: 09/21/2019  PCP: [facility]  Admit date: 09/21/2019 Discharge date: 10/01/2019  Admitted From: Home Disposition:  Home  Discharge Condition:Stable CODE STATUS:DNR Diet recommendation:  Regular  Brief/Interim Summary:  Patient is a 63 year old male with history of throat  cancer status post radiation treatment, status post trach and PEG in 2015, chronic cough, history of tobacco/alcohol abuse, CKD stage III, chronic anemia who presents here with concern for declining health, weakness, confusion and shortness of breath.  He was noted to be hypotensive on arrival.  He failed bedside swallowing evaluation and was found to have aspiration pneumonia patient with chest x-ray and CT finding.  He was started on broad spectrum antibiotics for aspiration pneumonia. Covid -19  was negative.  SLP was consulted.  MBS was done and was found to have moderate aspiration risks and recommended n.p.o. IR/general surgery consulted for PEG are G-tube placement but declined week saying he is not a candidate.  On 09/26/2019 he became hypotensive and hypothermic so he was transferred to ICU.  Patient transferred back to [facility] service on 09/29/2019.  Palliative team met with family for goals of care discussion/possible hospice. Comfort care initiated.  He is being transferred to residential hospice today.  Following problems were addressed during his hospitalization:  Recurrent aspiration pneumonia/dysphagia: Speech was following.  MBS was done.  Has moderate aspiration risk.  Recommend n.p.o.  IR consulted for PEG tube placement.But due to ascites ,IR cannot place PEG tube.  General surgery also declined putting PEG or G-tube saying he is not a candidate from surgical standpoint. After discussion with family, he has been started on comfort care.  Oral diet started.  History of throat cancer: Status post radiation treatment.  On  trach.  Hypotension: Blood pressure continues to remain low in the upper 80s.    CKD stage III: Baseline creatinine 1.4-1.9   Chronic alcohol abuse: History of drinking 1 pint of liquor every day.    Chronic anemia: Most likely associated with chronic comorbidities/medical issues,chronic alcohol abuse.  He was transfused with a unit of  PRBC on 09/21/2019.   Lung nodule: Known finding.  CT chest showed Interval enlargement of a somewhat spiculated appearing nodule in the 8 focal right upper lobe, measuring 1.7 x 1.3 cm, previously 1.3 x 0.6 cm . Concerning for malignany.  Anasarca/ascites: Due to poor oral intake/hypoalbuminemia or liver cirrhosis.  Irregular/nodular surface of liver as per CT abdomen.Status post 2 L of paracentesis on 09/22/2019.  Culture negative.  No abdominal pain or tenderness on exam.  Thrombocytopenia: Chronic.  Associated  with chronic alcohol abuse.     Discharge Diagnoses:  Active Problems:   Acute kidney injury (HCC)   Malnutrition of moderate degree   Aspiration pneumonia (HCC)   Alcohol abuse   Hypokalemia   Hypoglycemia   Anemia   Lung nodule   Alcoholic cirrhosis of liver with ascites (HCC)   Goals of care, counseling/discussion   Advanced care planning/counseling discussion   Palliative care by specialist   Dysphagia   Ascites    Discharge Instructions  Discharge Instructions    Diet general   Complete by: As directed      Allergies as of 10/01/2019   No Known Allergies     Medication List    TAKE these medications   amoxicillin-clavulanate 875-125 MG tablet Commonly known as: AUGMENTIN Take 1 tablet by mouth 2 (two) times daily.   atorvastatin 10 MG tablet  Commonly known as: LIPITOR Take 10 mg by mouth daily.   furosemide 40 MG tablet Commonly known as: LASIX Take 40 mg by mouth 2 (two) times daily.   levothyroxine 100 MCG tablet Commonly known as: SYNTHROID Take 100 mcg by mouth daily before breakfast.    midodrine 5 MG tablet Commonly known as: PROAMATINE Take 5 mg by mouth 2 (two) times daily with a meal.   Oxcarbazepine 300 MG tablet Commonly known as: TRILEPTAL Take 450 mg by mouth at bedtime.   tamsulosin 0.4 MG Caps capsule Commonly known as: FLOMAX Take 0.4 mg by mouth at bedtime.       No Known Allergies  Consultations: PCCM,palliative care  Procedures/Studies: Ct Abdomen Wo Contrast  Result Date: 09/26/2019 CLINICAL DATA:  63 year old male with dysphagia EXAM: CT ABDOMEN WITHOUT CONTRAST TECHNIQUE: Multidetector CT imaging of the abdomen was performed following the standard protocol without IV contrast. COMPARISON:  09/03/2015, CT chest 09/21/2019 FINDINGS: Lower chest: Bilateral pleural fluid with scalloping configuration along the margins in a non dependent position bilaterally indicating some complexity. Mixed ground-glass and nodular opacities of the bilateral lung bases with respiratory motion. Differential attenuation of the blood pool and the myocardium. Hepatobiliary: Irregular/nodular surface of liver. Unremarkable gallbladder with no hyperdense material within the lumen. Pancreas: Unremarkable Spleen: Unremarkable Adrenals/Urinary Tract: Adrenal glands not well evaluated. Right kidney is atrophic with density of the medullary region. No hydronephrosis. Left kidney appears somewhat edematous. No hydronephrosis. Nonobstructing stone at the inferior left collecting system of 1 mm-2 mm. Stomach/Bowel: Small hiatal hernia. Stomach is decompressed. There appears to be a gastropexy site with a small gas locule in the soft tissues, continuous with soft tissue in the body wall. This indicates a previous gastrostomy tube. Small bowel decompressed. Small bowel loops in the left abdomen demonstrates circumferential wall thickening. The wall is not well evaluated given the absence of IV contrast. Partially retained enteric contrast within the colon which is nondistended.  Vascular/Lymphatic: Vascular calcifications.  No adenopathy. Other: Low-density ascites throughout the abdomen. Edematous body wall and stranding of the mesentery. Musculoskeletal: No acute displaced fracture identified. Advanced degenerative changes of the spine. No bony canal narrowing. IMPRESSION: Changes of a prior gastrostomy tube with gastropexy site. Fluid positive state with ascites, mesenteric and body wall edema, and bilateral pleural effusions. Configuration of the bilateral pleural effusions suggest some complexity, with background of lung changes which may indicate inflammation/infection as well as chronic changes/atelectasis. Correlation with any chest imaging may be useful. Cirrhotic morphology of the liver. 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 Edematous left kidney with no hydronephrosis. If there is concern for ascending urinary tract infection, recommend correlation with urinalysis. Small bowel demonstrates mild wall thickening, predominantly within the left abdomen. This is nonspecific, and may be sequela of fluid positive state, however, enteritis or ischemia not excluded on the current CT. Evidence of anemia. Aortic Atherosclerosis (ICD10-I70.0). Electronically Signed   By: Corrie Mckusick D.O.   On: 09/26/2019 07:48   Ct Chest Wo Contrast  Result Date: 09/21/2019 CLINICAL DATA:  Weakness, trouble swallowing, aspirating EXAM: CT CHEST WITHOUT CONTRAST TECHNIQUE: Multidetector CT imaging of the chest was performed following the standard protocol without IV contrast. COMPARISON:  09/07/2018 FINDINGS: Cardiovascular: Aortic atherosclerosis. Normal heart size. Three-vessel coronary artery calcifications. No pericardial effusion. Mediastinum/Nodes: No enlarged mediastinal, hilar, or axillary lymph nodes. Thyroid gland, trachea, and esophagus demonstrate no significant findings. Lungs/Pleura: Moderate centrilobular emphysema. Tracheostomy. Small bilateral pleural effusions.  There is extensive frothy debris within  the dependent airways of the lungs bilaterally with ground-glass and clustered nodular airspace disease in the lung bases, left greater than right. No significant change in cavitary lesions of the dependent bilateral upper lungs (series 4, image 37, 52). Interval enlargement of a somewhat spiculated appearing nodule in the 8 focal right upper lobe, measuring 1.7 x 1.3 cm, previously 1.3 x 0.6 cm (series 4, image 42). Evidence of prior left upper lobe wedge resection. Unchanged, somewhat spiculated appearing consolidation architectural distortion of the perihilar left upper lobe (series 4, image 75). Upper Abdomen: Moderate volume ascites in the partially included upper abdomen. Musculoskeletal: No chest wall mass or suspicious bone lesions identified. IMPRESSION: 1. There is extensive frothy debris within the dependent airways of the lungs bilaterally with ground-glass and clustered nodular airspace disease in the lung bases, left greater than right. Findings are consistent with extensive aspiration, which may be ongoing. 2.  Small bilateral pleural effusions. 3. Interval enlargement of a somewhat spiculated appearing nodule in the 8 focal right upper lobe, measuring 1.7 x 1.3 cm, previously 1.3 x 0.6 cm (series 4, image 42). This finding is concerning for malignancy. Consider characterization for metabolic activity by PET-CT or alternately tissue sampling. 4. No significant change in cavitary lesions of the dependent bilateral upper lungs (series 4, image 37, 52), likely sequelae of prior infection. 5.  Tracheostomy. 6.  Emphysema (ICD10-J43.9). 7.  Coronary artery disease.  Aortic Atherosclerosis (ICD10-I70.0). 8.  Ascites in the included upper abdomen. Electronically Signed   By: Eddie Candle M.D.   On: 09/21/2019 21:55   US Paracentesis  Result Date: 09/23/2019 INDICATION: Patient with history of throat cancer, aspiration pneumonia, chronic kidney disease,  alcohol/tobacco abuse, ascites. Request made for diagnostic and therapeutic paracentesis. EXAM: ULTRASOUND GUIDED DIAGNOSTIC AND THERAPEUTIC PARACENTESIS MEDICATIONS: None COMPLICATIONS: None immediate. PROCEDURE: Informed written consent was obtained from the patient after a discussion of the risks, benefits and alternatives to treatment. A timeout was performed prior to the initiation of the procedure. Initial ultrasound scanning demonstrates a small to moderate amount of ascites within the right upper to mid abdominal quadrant. The right upper to mid abdomen was prepped and draped in the usual sterile fashion. 1% lidocaine was used for local anesthesia. Following this, a 19 gauge, 7-cm, Yueh catheter was introduced. An ultrasound image was saved for documentation purposes. The paracentesis was performed. The catheter was removed and a dressing was applied. The patient tolerated the procedure well without immediate post procedural complication. FINDINGS: A total of approximately 2.2 liters of yellow fluid was removed. Samples were sent to the laboratory as requested by the clinical team. IMPRESSION: Successful ultrasound-guided diagnostic and therapeutic paracentesis yielding 2.2 liters of peritoneal fluid. Read by: Rowe Robert, PA-C Electronically Signed   By: Corrie Mckusick D.O.   On: 09/22/2019 15:55   Dg Chest Port 1 View  Result Date: 09/26/2019 CLINICAL DATA:  Possible aspiration EXAM: PORTABLE CHEST 1 VIEW COMPARISON:  09/26/2019 FINDINGS: Tracheostomy tube is again noted. Cardiac shadow is stable. Right pleural thickening is again noted and stable as is similar findings on the left but to a lesser degree. Patchy airspace opacity is noted throughout the right lung similar to that seen on the prior exam. Bullous changes are again seen in the apices. IMPRESSION: Stable appearance of the chest when compared with the prior exam with persistent airspace opacity primarily within the right lung. Electronically  Signed   By: Inez Catalina M.D.   On: 09/26/2019 20:08   Dg Chest  Port 1 View  Result Date: 09/26/2019 CLINICAL DATA:  Aspiration pneumonia. EXAM: PORTABLE CHEST 1 VIEW COMPARISON:  10-20 FINDINGS: 1203 hours. The cardio pericardial silhouette is enlarged. Tracheostomy tube again noted. Parenchymal scarring and bullous change evident in the upper lungs. Diffuse interstitial opacity is similar. Interval development of right base airspace disease and right pleural effusion, potentially loculated. Bones are diffusely demineralized. Insert wire IMPRESSION: Interval development of right base airspace opacity with right pleural effusion, potentially loculated. Electronically Signed   By: Misty Stanley M.D.   On: 09/26/2019 14:05   Dg Chest Portable 1 View  Result Date: 09/21/2019 CLINICAL DATA:  Weakness and altered mental status. Shortness of breath. EXAM: PORTABLE CHEST 1 VIEW COMPARISON:  CT chest 09/07/2018 FINDINGS: Mild improvement in the right mid lung and upper lung airspace opacities compared to the chest CT of 09/07/2018. Mildly improved left upper lobe airspace opacity. Left rib deformities noted. Tracheostomy tube noted. Mildly increased hazy interstitial opacity in the lung bases, left greater than right. Heart size within normal limits. IMPRESSION: 1. Improvement in the previous airspace opacities in the upper lobes, although these have not resolved. Prior CT referenced a 1.3 cm solid pulmonary nodule which will need follow as detailed in the prior CT report. 2. Accentuated interstitial accentuation in the lung bases, cause uncertain. This could be from noncardiogenic edema or superimposed atypical pneumonia. Electronically Signed   By: Van Clines M.D.   On: 09/21/2019 19:39   Dg Abd Portable 1v  Result Date: 09/24/2019 CLINICAL DATA:  Nasogastric tube placement. EXAM: PORTABLE ABDOMEN - 1 VIEW COMPARISON:  Chest CT 09/21/2019. FINDINGS: 1840 hours. Enteric tube is looped in the distal  stomach with the tip projecting superiorly to the level of the gastric fundus. The visualized bowel gas pattern is normal. There is contrast material within the right colon. There are prominent paraspinal osteophytes throughout the thoracolumbar spine. Bibasilar pulmonary opacities and pleural effusions are similar to recent chest CT. IMPRESSION: The nasogastric tube is looped in the stomach with the tip projecting over the gastric fundus. Electronically Signed   By: Richardean Sale M.D.   On: 09/24/2019 19:09   Dg Swallowing Func-speech Pathology  Result Date: 09/22/2019 Objective Swallowing Evaluation: Type of Study: MBS-Modified Barium Swallow Study  Patient Details Name: ISREAL MOLINE MRN: [id number] Date of Birth: 1956/06/06 Today's Date: 09/22/2019 Time: SLP Start Time (ACUTE ONLY): 1555 -SLP Stop Time (ACUTE ONLY): 1615 SLP Time Calculation (min) (ACUTE ONLY): 20 min Past Medical History: Past Medical History: Diagnosis Date . Cancer [facility])  Past Surgical History: No past surgical history on file. HPI: Patient is a 63 y.o. male with PMH: throat cancer and radiation treatment s/p trach and PEG in 2015,  chronic cough, h/o tobacco and alcohol abuse, h/o lung infection, CKD stage III, chronic anemia, who was brought to ED on 10/2 with decline in health, weakness, confusion and some SOB. Patient was recent admit to [place] from 9/28 to 10/1. CXR revealed aspiration PNA bilaterally; Covid 19 test negative. Patient's PEG tube "fell out" 5-weeks and has not been replaced.  Subjective: pleasant, did not seem to be the best historian Assessment / Plan / Recommendation CHL IP CLINICAL IMPRESSIONS 09/22/2019 Clinical Impression Patient presents with a severe pharyngeal dysphagia which is both sensory and motor based with significant impact from anatomy s/p chemoradiation in 2015.  Cuffless shiley #6 trach in place with PMV in place during assessment. Anatomy was difficult to fully assess and SLP could not visualize an  epiglottis. All tested boluses of puree solids and thin liquids resulted in moderate to severe aspiration during and after the swallow, with boluses branching off with approximately 60% transiting through esophagus and 40% transiting into trachea. Residuals were observed at level of what would be vallecular sinus, and patient had to effortfully swallow for residiuals to transit, and again, aspiration occured. Aspiration was silent, however patient did start to reflexively cought when large amount of aspirated had transited past his vocal cords. He was able to clear minimal amount of aspirate with cued cough. He is chronically aspirating and unfortunately, no indication that improvement could be made in his swallow function.  SLP Visit Diagnosis Dysphagia, pharyngeal phase (R13.13) Attention and concentration deficit following -- Frontal lobe and executive function deficit following -- Impact on safety and function Severe aspiration risk;Risk for inadequate nutrition/hydration   CHL IP TREATMENT RECOMMENDATION 09/22/2019 Treatment Recommendations Therapy as outlined in treatment plan below   Prognosis 09/22/2019 Prognosis for Safe Diet Advancement Guarded Barriers to Reach Goals -- Barriers/Prognosis Comment -- CHL IP DIET RECOMMENDATION 09/22/2019 SLP Diet Recommendations NPO Liquid Administration via -- Medication Administration Via alternative means Compensations -- Postural Changes --   CHL IP OTHER RECOMMENDATIONS 09/22/2019 Recommended Consults -- Oral Care Recommendations Oral care QID Other Recommendations --   CHL IP FOLLOW UP RECOMMENDATIONS 09/22/2019 Follow up Recommendations None   CHL IP FREQUENCY AND DURATION 09/22/2019 Speech Therapy Frequency (ACUTE ONLY) min 1 x/week Treatment Duration 1 week      CHL IP ORAL PHASE 09/22/2019 Oral Phase WFL Oral - Pudding Teaspoon -- Oral - Pudding Cup -- Oral - Honey Teaspoon -- Oral - Honey Cup -- Oral - Nectar Teaspoon -- Oral - Nectar Cup -- Oral - Nectar Straw -- Oral  - Thin Teaspoon -- Oral - Thin Cup -- Oral - Thin Straw -- Oral - Puree -- Oral - Mech Soft -- Oral - Regular -- Oral - Multi-Consistency -- Oral - Pill -- Oral Phase - Comment --  CHL IP PHARYNGEAL PHASE 09/22/2019 Pharyngeal Phase Impaired Pharyngeal- Pudding Teaspoon -- Pharyngeal -- Pharyngeal- Pudding Cup -- Pharyngeal -- Pharyngeal- Honey Teaspoon -- Pharyngeal -- Pharyngeal- Honey Cup -- Pharyngeal -- Pharyngeal- Nectar Teaspoon -- Pharyngeal -- Pharyngeal- Nectar Cup -- Pharyngeal -- Pharyngeal- Nectar Straw -- Pharyngeal -- Pharyngeal- Thin Teaspoon -- Pharyngeal -- Pharyngeal- Thin Cup Delayed swallow initiation-vallecula;Reduced pharyngeal peristalsis;Reduced airway/laryngeal closure;Penetration/Aspiration during swallow;Penetration/Apiration after swallow;Moderate aspiration;Significant aspiration (Amount);Pharyngeal residue - valleculae Pharyngeal Material enters airway, passes BELOW cords without attempt by patient to eject out (silent aspiration) Pharyngeal- Thin Straw Delayed swallow initiation-vallecula;Reduced airway/laryngeal closure;Penetration/Aspiration during swallow;Penetration/Apiration after swallow;Significant aspiration (Amount);Moderate aspiration Pharyngeal Material enters airway, passes BELOW cords without attempt by patient to eject out (silent aspiration) Pharyngeal- Puree Delayed swallow initiation-vallecula;Pharyngeal residue - valleculae;Reduced airway/laryngeal closure;Penetration/Aspiration during swallow;Penetration/Apiration after swallow Pharyngeal Material enters airway, passes BELOW cords without attempt by patient to eject out (silent aspiration) Pharyngeal- Mechanical Soft NT Pharyngeal -- Pharyngeal- Regular NT Pharyngeal -- Pharyngeal- Multi-consistency NT Pharyngeal -- Pharyngeal- Pill NT Pharyngeal -- Pharyngeal Comment --  CHL IP CERVICAL ESOPHAGEAL PHASE 09/22/2019 Cervical Esophageal Phase Impaired Pudding Teaspoon -- Pudding Cup -- Honey Teaspoon -- Honey Cup --  Nectar Teaspoon -- Nectar Cup -- Nectar Straw -- Thin Teaspoon -- Thin Cup Reduced cricopharyngeal relaxation Thin Straw -- Puree Reduced cricopharyngeal relaxation Mechanical Soft -- Regular -- Multi-consistency -- Pill -- Cervical Esophageal Comment -- Dannial Monarch 09/22/2019, 5:54 PM    Sonia Baller, MA, CCC-SLP Speech Therapy [facility]  Pager: [phone]          [place] Ekg Site Rite  Result Date: 09/26/2019 If Site Rite image not attached, placement could not be confirmed due to current cardiac rhythm.  US Abdomen Limited Ruq  Result Date: 09/22/2019 CLINICAL DATA:  Ascites, evaluate for cirrhosis. EXAM: ULTRASOUND ABDOMEN LIMITED RIGHT UPPER QUADRANT COMPARISON:  None. FINDINGS: Gallbladder: No gallstones or wall thickening visualized. No sonographic Murphy sign noted by sonographer. Common bile duct: Diameter: 4 mm. Liver: Mild increased parenchymal echogenicity without focal mass. Portal vein is patent on color Doppler imaging with normal direction of blood flow towards the liver. Other: Scant amount of perihepatic fluid post paracentesis. IMPRESSION: No acute findings. Scant amount of perihepatic fluid post paracentesis. Mild hepatic steatosis without focal mass. Electronically Signed   By: Marin Olp M.D.   On: 09/22/2019 15:43       Subjective: Patient seen and examined the bedside this morning.  Unresponsive.  On full comfort care  Discharge Exam: Vitals:   10/01/19 0600 10/01/19 0700  BP:    Pulse: 64 65  Resp: 14 16  Temp:    SpO2: 95% 94%   Vitals:   10/01/19 0400 10/01/19 0500 10/01/19 0600 10/01/19 0700  BP:      Pulse: 69 67 64 65  Resp: '13 12 14 16  '$ Temp:      TempSrc:      SpO2: 93% 93% 95% 94%  Weight:      Height:        General: Pt is not alert, awake, not in acute distress Cardiovascular: RRR, S1/S2  Respiratory: CTA bilaterally, no wheezing, no rhonchi Abdominal: Soft, NT, ND Extremities: no edema, no cyanosis    The results of  significant diagnostics from this hospitalization (including imaging, microbiology, ancillary and laboratory) are listed below for reference.     Microbiology: Recent Results (from the past 240 hour(s))  SARS Coronavirus 2 [facility] order, Performed in [facility] hospital lab) Nasopharyngeal Nasopharyngeal Swab     Status: None   Collection Time: 09/21/19  8:10 PM   Specimen: Nasopharyngeal Swab  Result Value Ref Range Status   SARS Coronavirus 2 NEGATIVE NEGATIVE Final    Comment: (NOTE) If result is NEGATIVE SARS-CoV-2 target nucleic acids are NOT DETECTED. The SARS-CoV-2 RNA is generally detectable in upper and lower  respiratory specimens during the acute phase of infection. The lowest  concentration of SARS-CoV-2 viral copies this assay can detect is 250  copies / mL. A negative result does not preclude SARS-CoV-2 infection  and should not be used as the sole basis for treatment or other  patient management decisions.  A negative result may occur with  improper specimen collection / handling, submission of specimen other  than nasopharyngeal swab, presence of viral mutation(s) within the  areas targeted by this assay, and inadequate number of viral copies  (<250 copies / mL). A negative result must be combined with clinical  observations, patient history, and epidemiological information. If result is POSITIVE SARS-CoV-2 target nucleic acids are DETECTED. The SARS-CoV-2 RNA is generally detectable in upper and lower  respiratory specimens dur ing the acute phase of infection.  Positive  results are indicative of active infection with SARS-CoV-2.  Clinical  correlation with patient history and other diagnostic information is  necessary to determine patient infection status.  Positive results do  not rule out bacterial infection or co-infection with other viruses. If result is PRESUMPTIVE POSTIVE SARS-CoV-2 nucleic acids MAY BE PRESENT.  A presumptive positive result was  obtained on the submitted specimen  and confirmed on repeat testing.  While 2019 novel coronavirus  (SARS-CoV-2) nucleic acids may be present in the submitted sample  additional confirmatory testing may be necessary for epidemiological  and / or clinical management purposes  to differentiate between  SARS-CoV-2 and other Sarbecovirus currently known to infect humans.  If clinically indicated additional testing with an alternate test  methodology [phone]) is advised. The SARS-CoV-2 RNA is generally  detectable in upper and lower respiratory sp ecimens during the acute  phase of infection. The expected result is Negative. Fact Sheet for Patients:  StrictlyIdeas.no Fact Sheet for Healthcare Providers: BankingDealers.co.za This test is not yet approved or cleared by the [place] FDA and has been authorized for detection and/or diagnosis of SARS-CoV-2 by FDA under an Emergency Use Authorization (EUA).  This EUA will remain in effect (meaning this test can be used) for the duration of the COVID-19 declaration under Section 564(b)(1) of the Act, 21 U.S.C. section 360bbb-3(b)(1), unless the authorization is terminated or revoked sooner. Performed at [facility], [place] [address] [place], [place] [address]   MRSA PCR Screening     Status: Abnormal   Collection Time: 09/22/19  1:55 AM   Specimen: Nasal Mucosa; Nasopharyngeal  Result Value Ref Range Status   MRSA by PCR POSITIVE (A) NEGATIVE Final    Comment:        The GeneXpert MRSA Assay (FDA approved for NASAL specimens only), is one component of a comprehensive MRSA colonization surveillance program. It is not intended to diagnose MRSA infection nor to guide or monitor treatment for MRSA infections. RESULT CALLED TO, READ BACK BY AND VERIFIED WITHPricilla Larsson RN 09/22/19 0420 JDW Performed at [facility] [address], [place], [place] [address]   Culture, blood  (routine x 2)     Status: None   Collection Time: 09/22/19 11:58 AM   Specimen: BLOOD RIGHT ARM  Result Value Ref Range Status   Specimen Description BLOOD RIGHT ARM  Final   Special Requests   Final    BOTTLES DRAWN AEROBIC AND ANAEROBIC Blood Culture adequate volume   Culture   Final    NO GROWTH 5 DAYS Performed at [facility], [address] [place], [place] [address]    Report Status 09/27/2019 FINAL  Final  Culture, blood (routine x 2)     Status: None   Collection Time: 09/22/19 11:58 AM   Specimen: BLOOD RIGHT ARM  Result Value Ref Range Status   Specimen Description BLOOD RIGHT ARM  Final   Special Requests   Final    BOTTLES DRAWN AEROBIC AND ANAEROBIC Blood Culture adequate volume   Culture   Final    NO GROWTH 5 DAYS Performed at [facility], [place] [address] [place], [place] [address]    Report Status 09/27/2019 FINAL  Final  Respiratory Panel by PCR     Status: None   Collection Time: 09/22/19 12:33 PM   Specimen: Nasopharyngeal Swab; Respiratory  Result Value Ref Range Status   Adenovirus NOT DETECTED NOT DETECTED Final   Coronavirus 229E NOT DETECTED NOT DETECTED Final    Comment: (NOTE) The Coronavirus on the Respiratory Panel, DOES NOT test for the novel  Coronavirus (2019 nCoV)    Coronavirus HKU1 NOT DETECTED NOT DETECTED Final   Coronavirus NL63 NOT DETECTED NOT DETECTED Final   Coronavirus OC43 NOT DETECTED NOT DETECTED Final  Metapneumovirus NOT DETECTED NOT DETECTED Final   Rhinovirus / Enterovirus NOT DETECTED NOT DETECTED Final   Influenza A NOT DETECTED NOT DETECTED Final   Influenza B NOT DETECTED NOT DETECTED Final   Parainfluenza Virus 1 NOT DETECTED NOT DETECTED Final   Parainfluenza Virus 2 NOT DETECTED NOT DETECTED Final   Parainfluenza Virus 3 NOT DETECTED NOT DETECTED Final   Parainfluenza Virus 4 NOT DETECTED NOT DETECTED Final   Respiratory Syncytial Virus NOT DETECTED NOT DETECTED Final   Bordetella pertussis NOT  DETECTED NOT DETECTED Final   Chlamydophila pneumoniae NOT DETECTED NOT DETECTED Final   Mycoplasma pneumoniae NOT DETECTED NOT DETECTED Final    Comment: Performed at [facility], [place] [address], [place], [place] [address]  Gram stain     Status: None   Collection Time: 09/22/19  3:35 PM   Specimen: PATH Cytology Peritoneal fluid  Result Value Ref Range Status   Specimen Description PERITONEAL  Final   Special Requests NONE  Final   Gram Stain   Final    NO WBC SEEN NO ORGANISMS SEEN Performed at [facility], [address] [place], [place] [address]    Report Status 09/22/2019 FINAL  Final  Culture, body fluid-bottle     Status: None   Collection Time: 09/22/19  3:35 PM   Specimen: Peritoneal Washings  Result Value Ref Range Status   Specimen Description PERITONEAL  Final   Special Requests NONE  Final   Culture   Final    NO GROWTH 5 DAYS Performed at [facility], [address], [place], [place] [address]    Report Status 09/27/2019 FINAL  Final  Culture, blood (routine x 2)     Status: None (Preliminary result)   Collection Time: 09/26/19 11:23 PM   Specimen: BLOOD RIGHT HAND  Result Value Ref Range Status   Specimen Description BLOOD RIGHT HAND  Final   Special Requests   Final    BOTTLES DRAWN AEROBIC AND ANAEROBIC Blood Culture adequate volume   Culture   Final    NO GROWTH 4 DAYS Performed at [facility], [place] [address], [place], [place] [address]    Report Status PENDING  Incomplete  Culture, blood (routine x 2)     Status: None (Preliminary result)   Collection Time: 09/26/19 11:23 PM   Specimen: BLOOD LEFT HAND  Result Value Ref Range Status   Specimen Description BLOOD LEFT HAND  Final   Special Requests   Final    BOTTLES DRAWN AEROBIC AND ANAEROBIC Blood Culture adequate volume   Culture   Final    NO GROWTH 4 DAYS Performed at [facility], [place] [address] [place], [place] [address]    Report Status PENDING   Incomplete     Labs: BNP (last 3 results) Recent Labs    09/21/19 1922  BNP 115.7*   Basic Metabolic Panel: Recent Labs  Lab 09/24/19 1041 09/25/19 0352 09/26/19 0357 09/26/19 1954 09/26/19 2300 09/27/19 0231 09/28/19 0245  NA 147* 146* 141 142 140 142  --   K 3.1* 4.0 3.3* 3.1* 3.3* 3.8  --   CL 114* 115* 111  --  112* 115*  --   CO2 24 21* 21*  --  20* 20*  --   GLUCOSE 86 71 81  --  136* 96  --   BUN '12 13 12  '$ --  11 11  --   CREATININE 1.73* 1.89*  1.81*  --  1.67* 1.78*  --   CALCIUM 8.0* 7.9* 7.8*  --  7.7* 7.8*  --   MG 1.7  --   --   --   --  1.4* 2.1   Liver Function Tests: Recent Labs  Lab 09/27/19 0231  AST 16  ALT 9  ALKPHOS 38  BILITOT 0.6  PROT 5.8*  ALBUMIN 2.4*   No results for input(s): LIPASE, AMYLASE in the last 168 hours. Recent Labs  Lab 09/26/19 2300  AMMONIA 47*   CBC: Recent Labs  Lab 09/24/19 1041 09/25/19 0352 09/26/19 0357 09/26/19 1954 09/27/19 0231 09/28/19 0245  WBC 3.0* 3.7* 3.1*  --  3.7* 7.9  NEUTROABS 2.1 2.4 2.0  --  3.1 7.4  HGB 9.6* 10.4* 9.7* 9.9* 9.4* 10.2*  HCT 29.6* 30.0* 30.0* 29.0* 28.3* 30.2*  MCV 100.3* 96.2 100.7*  --  99.0 98.7  PLT 85* 78* 72*  --  81* 87*   Cardiac Enzymes: No results for input(s): CKTOTAL, CKMB, CKMBINDEX, TROPONINI in the last 168 hours. BNP: Invalid input(s): POCBNP CBG: Recent Labs  Lab 09/27/19 1958 09/27/19 2353 09/28/19 0416 09/28/19 0717 09/28/19 1117  GLUCAP 106* 86 77 78 81   D-Dimer No results for input(s): DDIMER in the last 72 hours. Hgb A1c No results for input(s): HGBA1C in the last 72 hours. Lipid Profile No results for input(s): CHOL, HDL, LDLCALC, TRIG, CHOLHDL, LDLDIRECT in the last 72 hours. Thyroid function studies No results for input(s): TSH, T4TOTAL, T3FREE, THYROIDAB in the last 72 hours.  Invalid input(s): FREET3 Anemia work up No results for input(s): VITAMINB12, FOLATE, FERRITIN, TIBC, IRON, RETICCTPCT in the last 72  hours. Urinalysis No results found for: COLORURINE, APPEARANCEUR, LABSPEC, [place], GLUCOSEU, [place], BILIRUBINUR, KETONESUR, PROTEINUR, UROBILINOGEN, NITRITE, LEUKOCYTESUR Sepsis Labs Invalid input(s): PROCALCITONIN,  WBC,  [place] Recent Results (from the past 240 hour(s))  SARS Coronavirus 2 [facility] order, Performed in [facility] hospital lab) Nasopharyngeal Nasopharyngeal Swab     Status: None   Collection Time: 09/21/19  8:10 PM   Specimen: Nasopharyngeal Swab  Result Value Ref Range Status   SARS Coronavirus 2 NEGATIVE NEGATIVE Final    Comment: (NOTE) If result is NEGATIVE SARS-CoV-2 target nucleic acids are NOT DETECTED. The SARS-CoV-2 RNA is generally detectable in upper and lower  respiratory specimens during the acute phase of infection. The lowest  concentration of SARS-CoV-2 viral copies this assay can detect is 250  copies / mL. A negative result does not preclude SARS-CoV-2 infection  and should not be used as the sole basis for treatment or other  patient management decisions.  A negative result may occur with  improper specimen collection / handling, submission of specimen other  than nasopharyngeal swab, presence of viral mutation(s) within the  areas targeted by this assay, and inadequate number of viral copies  (<250 copies / mL). A negative result must be combined with clinical  observations, patient history, and epidemiological information. If result is POSITIVE SARS-CoV-2 target nucleic acids are DETECTED. The SARS-CoV-2 RNA is generally detectable in upper and lower  respiratory specimens dur ing the acute phase of infection.  Positive  results are indicative of active infection with SARS-CoV-2.  Clinical  correlation with patient history and other diagnostic information is  necessary to determine patient infection status.  Positive results do  not rule out bacterial infection or co-infection with other viruses. If result is PRESUMPTIVE  POSTIVE SARS-CoV-2 nucleic acids MAY BE PRESENT.   A presumptive  positive result was obtained on the submitted specimen  and confirmed on repeat testing.  While 2019 novel coronavirus  (SARS-CoV-2) nucleic acids may be present in the submitted sample  additional confirmatory testing may be necessary for epidemiological  and / or clinical management purposes  to differentiate between  SARS-CoV-2 and other Sarbecovirus currently known to infect humans.  If clinically indicated additional testing with an alternate test  methodology [phone]) is advised. The SARS-CoV-2 RNA is generally  detectable in upper and lower respiratory sp ecimens during the acute  phase of infection. The expected result is Negative. Fact Sheet for Patients:  StrictlyIdeas.no Fact Sheet for Healthcare Providers: BankingDealers.co.za This test is not yet approved or cleared by the [place] FDA and has been authorized for detection and/or diagnosis of SARS-CoV-2 by FDA under an Emergency Use Authorization (EUA).  This EUA will remain in effect (meaning this test can be used) for the duration of the COVID-19 declaration under Section 564(b)(1) of the Act, 21 U.S.C. section 360bbb-3(b)(1), unless the authorization is terminated or revoked sooner. Performed at [facility], [place] [address] [place], [place] [address]   MRSA PCR Screening     Status: Abnormal   Collection Time: 09/22/19  1:55 AM   Specimen: Nasal Mucosa; Nasopharyngeal  Result Value Ref Range Status   MRSA by PCR POSITIVE (A) NEGATIVE Final    Comment:        The GeneXpert MRSA Assay (FDA approved for NASAL specimens only), is one component of a comprehensive MRSA colonization surveillance program. It is not intended to diagnose MRSA infection nor to guide or monitor treatment for MRSA infections. RESULT CALLED TO, READ BACK BY AND VERIFIED WITHPricilla Larsson RN 09/22/19 0420 JDW Performed  at [facility] [address] [place], [place] [address]   Culture, blood (routine x 2)     Status: None   Collection Time: 09/22/19 11:58 AM   Specimen: BLOOD RIGHT ARM  Result Value Ref Range Status   Specimen Description BLOOD RIGHT ARM  Final   Special Requests   Final    BOTTLES DRAWN AEROBIC AND ANAEROBIC Blood Culture adequate volume   Culture   Final    NO GROWTH 5 DAYS Performed at [facility], [address] [place], [place] [address]    Report Status 09/27/2019 FINAL  Final  Culture, blood (routine x 2)     Status: None   Collection Time: 09/22/19 11:58 AM   Specimen: BLOOD RIGHT ARM  Result Value Ref Range Status   Specimen Description BLOOD RIGHT ARM  Final   Special Requests   Final    BOTTLES DRAWN AEROBIC AND ANAEROBIC Blood Culture adequate volume   Culture   Final    NO GROWTH 5 DAYS Performed at [facility], [place] [address], [place], [place] [address]    Report Status 09/27/2019 FINAL  Final  Respiratory Panel by PCR     Status: None   Collection Time: 09/22/19 12:33 PM   Specimen: Nasopharyngeal Swab; Respiratory  Result Value Ref Range Status   Adenovirus NOT DETECTED NOT DETECTED Final   Coronavirus 229E NOT DETECTED NOT DETECTED Final    Comment: (NOTE) The Coronavirus on the Respiratory Panel, DOES NOT test for the novel  Coronavirus (2019 nCoV)    Coronavirus HKU1 NOT DETECTED NOT DETECTED Final   Coronavirus NL63 NOT DETECTED NOT DETECTED Final   Coronavirus OC43 NOT DETECTED NOT DETECTED Final   Metapneumovirus  NOT DETECTED NOT DETECTED Final   Rhinovirus / Enterovirus NOT DETECTED NOT DETECTED Final   Influenza A NOT DETECTED NOT DETECTED Final   Influenza B NOT DETECTED NOT DETECTED Final   Parainfluenza Virus 1 NOT DETECTED NOT DETECTED Final   Parainfluenza Virus 2 NOT DETECTED NOT DETECTED Final   Parainfluenza Virus 3 NOT DETECTED NOT DETECTED Final   Parainfluenza Virus 4 NOT DETECTED NOT DETECTED Final    Respiratory Syncytial Virus NOT DETECTED NOT DETECTED Final   Bordetella pertussis NOT DETECTED NOT DETECTED Final   Chlamydophila pneumoniae NOT DETECTED NOT DETECTED Final   Mycoplasma pneumoniae NOT DETECTED NOT DETECTED Final    Comment: Performed at [facility], [place] [address] [place], [place] [address]  Gram stain     Status: None   Collection Time: 09/22/19  3:35 PM   Specimen: PATH Cytology Peritoneal fluid  Result Value Ref Range Status   Specimen Description PERITONEAL  Final   Special Requests NONE  Final   Gram Stain   Final    NO WBC SEEN NO ORGANISMS SEEN Performed at [facility], [address], [place], [place] [address]    Report Status 09/22/2019 FINAL  Final  Culture, body fluid-bottle     Status: None   Collection Time: 09/22/19  3:35 PM   Specimen: Peritoneal Washings  Result Value Ref Range Status   Specimen Description PERITONEAL  Final   Special Requests NONE  Final   Culture   Final    NO GROWTH 5 DAYS Performed at [facility], [address], [place], [place] [address]    Report Status 09/27/2019 FINAL  Final  Culture, blood (routine x 2)     Status: None (Preliminary result)   Collection Time: 09/26/19 11:23 PM   Specimen: BLOOD RIGHT HAND  Result Value Ref Range Status   Specimen Description BLOOD RIGHT HAND  Final   Special Requests   Final    BOTTLES DRAWN AEROBIC AND ANAEROBIC Blood Culture adequate volume   Culture   Final    NO GROWTH 4 DAYS Performed at [facility], [place] [address] [place], [place] [address]    Report Status PENDING  Incomplete  Culture, blood (routine x 2)     Status: None (Preliminary result)   Collection Time: 09/26/19 11:23 PM   Specimen: BLOOD LEFT HAND  Result Value Ref Range Status   Specimen Description BLOOD LEFT HAND  Final   Special Requests   Final    BOTTLES DRAWN AEROBIC AND ANAEROBIC Blood Culture adequate volume   Culture   Final    NO GROWTH 4 DAYS Performed at [facility], [place] [address] [place], [place] [address]    Report Status PENDING  Incomplete    Please note: You were cared for by a hospitalist during your hospital stay. Once you are discharged, your primary care physician will handle any further medical issues. Please note that NO REFILLS for any discharge medications will be authorized once you are discharged, as it is imperative that you return to your primary care physician (or establish a relationship with a primary care physician if you do not have one) for your post hospital discharge needs so that they can reassess your need for medications and monitor your lab values.    Time coordinating discharge: 40 minutes  SIGNED:   Shelly Coss, MD  [facility] 10/01/2019, 8:08 AM Pager [phone]  If 7PM-7AM, please contact  night-coverage [email or web address] Password [email or web address]

## 2019-10-02 LAB — CULTURE, BLOOD (ROUTINE X 2)
Culture: NO GROWTH
Culture: NO GROWTH
Special Requests: ADEQUATE
Special Requests: ADEQUATE

## 2019-10-21 DEATH — deceased
